# Patient Record
Sex: Male | Born: 1960
Health system: Southern US, Community
[De-identification: ages and names within clinical notes are randomized; demographics above are authoritative.]

## PROBLEM LIST (undated history)

## (undated) DIAGNOSIS — I2510 Atherosclerotic heart disease of native coronary artery without angina pectoris: Secondary | ICD-10-CM

## (undated) DIAGNOSIS — Z7182 Exercise counseling: Secondary | ICD-10-CM

## (undated) DIAGNOSIS — G8929 Other chronic pain: Secondary | ICD-10-CM

## (undated) DIAGNOSIS — M25559 Pain in unspecified hip: Secondary | ICD-10-CM

## (undated) DIAGNOSIS — I10 Essential (primary) hypertension: Secondary | ICD-10-CM

## (undated) DIAGNOSIS — E119 Type 2 diabetes mellitus without complications: Secondary | ICD-10-CM

## (undated) DIAGNOSIS — Z973 Presence of spectacles and contact lenses: Secondary | ICD-10-CM

## (undated) DIAGNOSIS — K08109 Complete loss of teeth, unspecified cause, unspecified class: Secondary | ICD-10-CM

## (undated) DIAGNOSIS — Z993 Dependence on wheelchair: Secondary | ICD-10-CM

## (undated) DIAGNOSIS — M1990 Unspecified osteoarthritis, unspecified site: Secondary | ICD-10-CM

## (undated) DIAGNOSIS — R29898 Other symptoms and signs involving the musculoskeletal system: Secondary | ICD-10-CM

## (undated) DIAGNOSIS — E785 Hyperlipidemia, unspecified: Secondary | ICD-10-CM

## (undated) DIAGNOSIS — Z6841 Body Mass Index (BMI) 40.0 and over, adult: Secondary | ICD-10-CM

## (undated) DIAGNOSIS — Z972 Presence of dental prosthetic device (complete) (partial): Secondary | ICD-10-CM

## (undated) HISTORY — PX: LAPAROSCOPIC CHOLECYSTECTOMY: SUR755

## (undated) HISTORY — PX: INGUINAL HERNIA REPAIR: SUR1180

## (undated) HISTORY — DX: Exercise counseling: Z71.82

## (undated) HISTORY — DX: Essential (primary) hypertension: I10

## (undated) HISTORY — DX: Other symptoms and signs involving the musculoskeletal system: R29.898

## (undated) HISTORY — DX: Hyperlipidemia, unspecified: E78.5

## (undated) HISTORY — DX: Morbid (severe) obesity due to excess calories: E66.01

## (undated) HISTORY — DX: Body Mass Index (BMI) 40.0 and over, adult: Z684

---

## 2011-11-13 ENCOUNTER — Other Ambulatory Visit: Payer: Self-pay | Admitting: Orthopedic Surgery

## 2011-11-13 DIAGNOSIS — M25559 Pain in unspecified hip: Secondary | ICD-10-CM

## 2011-12-03 ENCOUNTER — Other Ambulatory Visit: Payer: Self-pay

## 2011-12-03 ENCOUNTER — Inpatient Hospital Stay: Admission: RE | Admit: 2011-12-03 | Payer: Self-pay | Source: Ambulatory Visit

## 2011-12-03 ENCOUNTER — Ambulatory Visit
Admission: RE | Admit: 2011-12-03 | Discharge: 2011-12-03 | Disposition: A | Discharge status: Home / Self Care | Payer: BC Managed Care – PPO | Source: Ambulatory Visit | Attending: Orthopedic Surgery | Admitting: Orthopedic Surgery

## 2011-12-03 DIAGNOSIS — M25559 Pain in unspecified hip: Secondary | ICD-10-CM

## 2011-12-03 MED ORDER — METHYLPREDNISOLONE ACETATE 40 MG/ML INJ SUSP (RADIOLOG
120.0000 mg | Freq: Once | INTRAMUSCULAR | Status: AC
  Administered 2011-12-03: 120 mg via INTRA_ARTICULAR

## 2011-12-03 MED ORDER — IOHEXOL 180 MG/ML  SOLN
1.0000 mL | Freq: Once | INTRAMUSCULAR | Status: AC | PRN
  Administered 2011-12-03: 1 mL via INTRA_ARTICULAR

## 2012-06-25 ENCOUNTER — Other Ambulatory Visit: Payer: Self-pay | Admitting: Orthopedic Surgery

## 2012-06-25 DIAGNOSIS — M169 Osteoarthritis of hip, unspecified: Secondary | ICD-10-CM

## 2012-06-25 DIAGNOSIS — M25552 Pain in left hip: Secondary | ICD-10-CM

## 2012-07-01 ENCOUNTER — Ambulatory Visit
Admission: RE | Admit: 2012-07-01 | Discharge: 2012-07-01 | Disposition: A | Discharge status: Home / Self Care | Payer: BC Managed Care – PPO | Source: Ambulatory Visit | Attending: Orthopedic Surgery | Admitting: Orthopedic Surgery

## 2012-07-01 DIAGNOSIS — M169 Osteoarthritis of hip, unspecified: Secondary | ICD-10-CM

## 2012-07-01 DIAGNOSIS — M25552 Pain in left hip: Secondary | ICD-10-CM

## 2012-07-01 MED ORDER — METHYLPREDNISOLONE ACETATE 40 MG/ML INJ SUSP (RADIOLOG
120.0000 mg | Freq: Once | INTRAMUSCULAR | Status: AC
  Administered 2012-07-01: 120 mg via INTRA_ARTICULAR

## 2012-07-01 MED ORDER — IOHEXOL 180 MG/ML  SOLN
1.0000 mL | Freq: Once | INTRAMUSCULAR | Status: AC | PRN
  Administered 2012-07-01: 1 mL via INTRA_ARTICULAR

## 2016-11-20 DIAGNOSIS — E119 Type 2 diabetes mellitus without complications: Secondary | ICD-10-CM | POA: Diagnosis not present

## 2016-11-20 DIAGNOSIS — I10 Essential (primary) hypertension: Secondary | ICD-10-CM | POA: Diagnosis not present

## 2016-11-20 DIAGNOSIS — E785 Hyperlipidemia, unspecified: Secondary | ICD-10-CM | POA: Diagnosis not present

## 2016-12-21 ENCOUNTER — Other Ambulatory Visit (HOSPITAL_COMMUNITY): Payer: Self-pay | Admitting: General Surgery

## 2016-12-21 DIAGNOSIS — E1169 Type 2 diabetes mellitus with other specified complication: Secondary | ICD-10-CM | POA: Diagnosis not present

## 2016-12-21 DIAGNOSIS — Z7409 Other reduced mobility: Secondary | ICD-10-CM | POA: Diagnosis not present

## 2016-12-21 DIAGNOSIS — M160 Bilateral primary osteoarthritis of hip: Secondary | ICD-10-CM | POA: Diagnosis not present

## 2016-12-21 DIAGNOSIS — E7800 Pure hypercholesterolemia, unspecified: Secondary | ICD-10-CM | POA: Diagnosis not present

## 2016-12-21 DIAGNOSIS — I10 Essential (primary) hypertension: Secondary | ICD-10-CM | POA: Diagnosis not present

## 2016-12-21 DIAGNOSIS — Z8719 Personal history of other diseases of the digestive system: Secondary | ICD-10-CM | POA: Diagnosis not present

## 2016-12-21 DIAGNOSIS — Z9889 Other specified postprocedural states: Secondary | ICD-10-CM | POA: Diagnosis not present

## 2016-12-21 DIAGNOSIS — E669 Obesity, unspecified: Secondary | ICD-10-CM | POA: Diagnosis not present

## 2017-01-08 ENCOUNTER — Other Ambulatory Visit: Payer: Self-pay

## 2017-01-08 ENCOUNTER — Ambulatory Visit (HOSPITAL_COMMUNITY)
Admission: RE | Admit: 2017-01-08 | Discharge: 2017-01-08 | Disposition: A | Discharge status: Home / Self Care | Payer: Medicare HMO | Source: Ambulatory Visit | Attending: General Surgery | Admitting: General Surgery

## 2017-01-08 DIAGNOSIS — R9431 Abnormal electrocardiogram [ECG] [EKG]: Secondary | ICD-10-CM | POA: Insufficient documentation

## 2017-01-08 DIAGNOSIS — Z01810 Encounter for preprocedural cardiovascular examination: Secondary | ICD-10-CM | POA: Diagnosis not present

## 2017-01-08 DIAGNOSIS — R918 Other nonspecific abnormal finding of lung field: Secondary | ICD-10-CM | POA: Diagnosis not present

## 2017-01-22 ENCOUNTER — Encounter: Payer: Medicare HMO | Attending: General Surgery | Admitting: Registered"

## 2017-01-22 ENCOUNTER — Encounter: Payer: Self-pay | Admitting: Registered"

## 2017-01-22 DIAGNOSIS — Z6841 Body Mass Index (BMI) 40.0 and over, adult: Secondary | ICD-10-CM | POA: Insufficient documentation

## 2017-01-22 DIAGNOSIS — E119 Type 2 diabetes mellitus without complications: Secondary | ICD-10-CM

## 2017-01-22 DIAGNOSIS — E669 Obesity, unspecified: Secondary | ICD-10-CM

## 2017-01-22 DIAGNOSIS — I10 Essential (primary) hypertension: Secondary | ICD-10-CM | POA: Insufficient documentation

## 2017-01-22 DIAGNOSIS — Z713 Dietary counseling and surveillance: Secondary | ICD-10-CM | POA: Insufficient documentation

## 2017-01-22 NOTE — Patient Instructions (Signed)
-   Reduce coffee intake.

## 2017-01-22 NOTE — Progress Notes (Signed)
Pre-Op Assessment Visit:  Pre-Operative Sleeve gastrectomy Surgery  Medical Nutrition Therapy:  Appt start time: 9:00  End time:  9:50  Patient was seen on 01/22/2017 for Pre-Operative Nutrition Assessment. Assessment and letter of approval faxed to [facility] Bariatric Surgery Program coordinator on 01/22/2017.   Pt expectation of surgery: to be able to walk again, to have 2 hip replacements  Pt expectation of Dietitian: none stated  Start weight at [facility]: 360.6 BMI: 53.25   Pt states he has had diabetes 5-10 years. Pt states he seldom checks his blood sugar, but when he does it is usually FBS (100). Pt states he does not experience hypoglycemia. Pt states his last A1c was 2 weeks agi but has not received results yet. Per referral, pt's last A1c was 7.1. Pt states he drinks a lot of coffee.   Per insurance, pt needs 6 SWL visits prior to surgery.    24 hr Dietary Recall: First Meal: sometimes skips; sausage, eggs, biscuit Snack: none  Second Meal: skips Snack: none Third Meal: meatloaf, peas, corn, mac and cheese Snack: sometimes oatmeal cookie Beverages: coffee, water, orange juice  Encouraged to engage in 45 minutes of moderate physical activity including cardiovascular and weight baring weekly  Handouts given during visit include:  . Pre-Op Goals . Bariatric Surgery Protein Shakes . Vitamin and Mineral Recommendations  During the appointment today the following Pre-Op Goals were reviewed with the patient: . Maintain or lose weight as instructed by your surgeon . Make healthy food choices . Begin to limit portion sizes . Limited concentrated sugars and fried foods . Keep fat/sugar in the single digits per serving on         food labels . Practice CHEWING your food  (aim for 30 chews per bite or until applesauce consistency) . Practice not drinking 15 minutes before, during, and 30 minutes after each meal/snack . Avoid all carbonated beverages   . Avoid/limit caffeinated beverages  . Avoid all sugar-sweetened beverages . Consume 3 meals per day; eat every 3-5 hours . Make a list of non-food related activities . Aim for 64-100 ounces of FLUID daily  . Aim for at least 60-80 grams of PROTEIN daily . Look for a liquid protein source that contain ?15 g protein and ?5 g carbohydrate  (ex: shakes, drinks, shots) . Physical activity is an important part of a healthy lifestyle so keep it moving!  - Reduce coffee intake.   Follow diet recommendations listed below Energy and Macronutrient Recommendations: Calories: 2000 Carbohydrate: 225 Protein: 150 Fat: 56  Demonstrated degree of understanding via:  Teach Back   Teaching Method Utilized:  Visual Auditory Hands on  Barriers to learning/adherence to lifestyle change: limited physical mobility  Patient to call the [facility] to enroll in Pre-Op and Post-Op Nutrition Education when surgery date is scheduled.

## 2017-01-24 ENCOUNTER — Ambulatory Visit: Payer: Medicare HMO | Admitting: Registered"

## 2017-02-20 DIAGNOSIS — Z23 Encounter for immunization: Secondary | ICD-10-CM | POA: Diagnosis not present

## 2017-02-20 DIAGNOSIS — E559 Vitamin D deficiency, unspecified: Secondary | ICD-10-CM | POA: Diagnosis not present

## 2017-02-20 DIAGNOSIS — I10 Essential (primary) hypertension: Secondary | ICD-10-CM | POA: Diagnosis not present

## 2017-02-20 DIAGNOSIS — E119 Type 2 diabetes mellitus without complications: Secondary | ICD-10-CM | POA: Diagnosis not present

## 2017-02-20 DIAGNOSIS — E785 Hyperlipidemia, unspecified: Secondary | ICD-10-CM | POA: Diagnosis not present

## 2017-02-21 ENCOUNTER — Encounter: Payer: Self-pay | Admitting: Registered"

## 2017-02-21 ENCOUNTER — Encounter: Payer: Medicare HMO | Attending: General Surgery | Admitting: Registered"

## 2017-02-21 DIAGNOSIS — E119 Type 2 diabetes mellitus without complications: Secondary | ICD-10-CM | POA: Diagnosis not present

## 2017-02-21 DIAGNOSIS — Z6841 Body Mass Index (BMI) 40.0 and over, adult: Secondary | ICD-10-CM | POA: Diagnosis not present

## 2017-02-21 DIAGNOSIS — Z713 Dietary counseling and surveillance: Secondary | ICD-10-CM | POA: Diagnosis not present
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
## 2017-02-21 NOTE — Patient Instructions (Addendum)
-   Aiming to eat 3 meals a day and add snacks between meals.   - Increase physical activity to at least 10-15 min, 3 days a week.

## 2017-02-26 ENCOUNTER — Ambulatory Visit: Payer: Medicare HMO | Admitting: Psychiatry

## 2017-03-19 ENCOUNTER — Encounter: Payer: Medicare HMO | Attending: General Surgery | Admitting: Registered"

## 2017-03-19 ENCOUNTER — Encounter: Payer: Self-pay | Admitting: Registered"

## 2017-03-19 DIAGNOSIS — E119 Type 2 diabetes mellitus without complications: Secondary | ICD-10-CM | POA: Insufficient documentation

## 2017-03-19 DIAGNOSIS — E669 Obesity, unspecified: Secondary | ICD-10-CM

## 2017-03-19 DIAGNOSIS — Z6841 Body Mass Index (BMI) 40.0 and over, adult: Secondary | ICD-10-CM | POA: Diagnosis not present

## 2017-03-19 DIAGNOSIS — Z713 Dietary counseling and surveillance: Secondary | ICD-10-CM | POA: Diagnosis not present

## 2017-03-19 NOTE — Progress Notes (Signed)
Appt start time: 9:05 end time: 9:18  Assessment: 2nd SWL Appointment.   Start Wt at [facility]: 360.6 Wt: 360.5 BMI: 53.24   Pt arrives having gained 12.8 lbs from previous visit.   Pt states he has done well with eating 3 meals a day. Pt states he drinks at least 2 32-oz bottles of water a day, along with 2 cups of coffee and juice. Pt states he sips is his fluid throughout the day. 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 Pt states he has not been able to do well with increasing physical activity since previous visit. Pt states he did it a few days and stopped.    MEDICATIONS: See list   DIETARY INTAKE:  24-hr recall:  B ( AM): sausage, egg, cheese biscuit  Snk ( AM): none  L ( PM): ham and cheese sandwich  Snk ( PM): none D ( PM): salisbury steak, mixed vegetables, potatoes  Snk ( PM): none Beverages: water, orange juice, 2 coffee   Usual physical activity: none stated  Diet to Follow: 2000 calories 225 g carbohydrates 150 g protein 56 g fat  Preferred Learning Style:   Auditory  Visual  Hands on  Learning Readiness:   Contemplating  Ready  Change in progress     Nutritional Diagnosis:  [place]-3.3 Overweight/obesity related to past poor dietary habits and physical inactivity as evidenced by patient w/ planned sleeve gastrectomy surgery following dietary guidelines for continued weight loss.    Intervention:  Nutrition counseling for upcoming Bariatric Surgery.  Goals:  - Increase physical activity to at least 10-15 min, 3 days a week; arm exercises handout.  - Keep up the good work with what you're already doing.   Teaching Method Utilized:  Visual Auditory Hands on  Handouts given during visit include:  none  Barriers to learning/adherence to lifestyle change: limited physical ability  Demonstrated degree of understanding via:  Teach Back   Monitoring/Evaluation:  Dietary intake, exercise, and  body weight in 1 month(s).

## 2017-03-19 NOTE — Patient Instructions (Addendum)
-   Increase physical activity to at least 10-15 min, 3 days a week; arm exercises handout.   - Keep up the good work with what you're already doing.

## 2017-03-28 ENCOUNTER — Ambulatory Visit (INDEPENDENT_AMBULATORY_CARE_PROVIDER_SITE_OTHER): Payer: Medicare HMO | Admitting: Psychiatry

## 2017-03-28 DIAGNOSIS — R69 Illness, unspecified: Secondary | ICD-10-CM | POA: Diagnosis not present

## 2017-03-28 DIAGNOSIS — F509 Eating disorder, unspecified: Secondary | ICD-10-CM

## 2017-04-18 ENCOUNTER — Encounter: Payer: Self-pay | Admitting: Registered"

## 2017-04-18 ENCOUNTER — Encounter: Payer: Medicare HMO | Attending: General Surgery | Admitting: Registered"

## 2017-04-18 DIAGNOSIS — Z713 Dietary counseling and surveillance: Secondary | ICD-10-CM | POA: Diagnosis not present

## 2017-04-18 DIAGNOSIS — E119 Type 2 diabetes mellitus without complications: Secondary | ICD-10-CM | POA: Diagnosis not present

## 2017-04-18 DIAGNOSIS — Z6841 Body Mass Index (BMI) 40.0 and over, adult: Secondary | ICD-10-CM | POA: Diagnosis not present

## 2017-04-18 DIAGNOSIS — E669 Obesity, unspecified: Secondary | ICD-10-CM

## 2017-04-18 NOTE — Progress Notes (Signed)
Appt start time: 9:00 end time: 9:15  Assessment: 3rd SWL Appointment.   Start Wt at [facility]: 360.6 Wt: 354.2 BMI: 52.31   Pt arrives having lost 6.3 lbs from previous visit. Pt states he has reduced drinking orange juice and has begun eating more oranges instead. Pt states he has been working out 30-60 min a day, almost daily with arm exercises most days. Pt states he has already started working on chewing well, but sometimes forgets. 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 Pt states he is still doing well with drinking a lot of water and eating 3 meals a day.     MEDICATIONS: See list   DIETARY INTAKE:  24-hr recall:  B ( AM): sausage, egg, cheese biscuit  Snk ( AM): none L ( PM): ham and cheese sandwich  Snk ( PM): 2 orange D ( PM): salisbury steak, mixed vegetables, potatoes  Snk ( PM): orange Beverages: water, small amount of orange juice, 2 coffee   Usual physical activity: arm exercises with 10 lbs barbells 30-60 min, 6 days/week  Diet to Follow: 2000 calories 225 g carbohydrates 150 g protein 56 g fat  Preferred Learning Style:   Auditory  Visual  Hands on  Learning Readiness:   Contemplating  Ready  Change in progress     Nutritional Diagnosis:  [place]-3.3 Overweight/obesity related to past poor dietary habits and physical inactivity as evidenced by patient w/ planned sleeve gastrectomy surgery following dietary guidelines for continued weight loss.    Intervention:  Nutrition counseling for upcoming Bariatric Surgery.  Goals:  - Aim to focus on chewing at least 30 times per bite or to applesauce consistency, especially with lunch to help with portion control.  - Aim to listen to your body for satisfaction when eating.    Teaching Method Utilized:  Visual Auditory Hands on  Handouts given during visit include:  none  Barriers to  learning/adherence to lifestyle change: limited physical ability  Demonstrated degree of understanding via:  Teach Back   Monitoring/Evaluation:  Dietary intake, exercise, and body weight in 1 month(s).

## 2017-04-18 NOTE — Patient Instructions (Signed)
-   Aim to focus on chewing at least 30 times per bite or to applesauce consistency, especially with lunch to help with portion control.   - Aim to listen to your body for satisfaction when eating.

## 2017-05-17 ENCOUNTER — Encounter: Payer: Medicare HMO | Attending: General Surgery | Admitting: Registered"

## 2017-05-17 ENCOUNTER — Ambulatory Visit: Payer: Self-pay | Admitting: Registered"

## 2017-05-17 ENCOUNTER — Encounter: Payer: Self-pay | Admitting: Registered"

## 2017-05-17 DIAGNOSIS — Z713 Dietary counseling and surveillance: Secondary | ICD-10-CM | POA: Diagnosis not present

## 2017-05-17 DIAGNOSIS — Z6841 Body Mass Index (BMI) 40.0 and over, adult: Secondary | ICD-10-CM | POA: Diagnosis not present

## 2017-05-17 DIAGNOSIS — E119 Type 2 diabetes mellitus without complications: Secondary | ICD-10-CM | POA: Insufficient documentation

## 2017-05-17 NOTE — Progress Notes (Signed)
Appt start time: 9:20 end time: 9:40  Assessment: 4th SWL Appointment.   Start Wt at [facility]: 360.6 Wt: 343.7 BMI: 50.76    Pt arrives having lost 10.5 lbs from previous visit. Pt states his brother had bariatric surgery and gave him a lot of protein shakes. Pt states he likes Premier Protein vanilla, Muscle Milk chocolate/dark chocolate flavors. Pt states he can hear when body is satisfied with amount eaten. Pt states he is eating 3-4 meals/day. Pt states he has diabetes but never has problems with it. Pt states he does not check BS hardly ever. Pt states his last A1c was good, but unsure the value, unsure when it was done. Pt states his A1c is checked every 6 months. Pt states he is aware of hypoglycemic sign/symptoms and knows what to do. Pt states he is doing better with chewing, sometimes forgets to chew when watching tv while eating.   Pt states he has reduced drinking orange juice and has begun eating more oranges instead. Pt states he has been working out 30-60 min a day, almost daily with arm exercises most days. Pt states he has already started working on chewing well, but sometimes forgets. 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 Pt states he is still doing well with drinking a lot of water and eating 3 meals a day.     MEDICATIONS: See list   DIETARY INTAKE:  24-hr recall:  B ( AM): sausage, egg, cheese biscuit  Snk ( AM): none L ( PM): ham and cheese sandwich  Snk ( PM): 2 orange D ( PM): salisbury steak, mixed vegetables, potatoes  Snk ( PM): orange Beverages: water, small amount of orange juice, 2 coffee   Usual physical activity: arm exercises with 10 lbs barbells 30-60 min, 6 days/week  Diet to Follow: 2000 calories 225 g carbohydrates 150 g protein 56 g fat  Preferred Learning Style:   Auditory  Visual  Hands on  Learning Readiness:    Contemplating  Ready  Change in progress     Nutritional Diagnosis:  [place]-3.3 Overweight/obesity related to past poor dietary habits and physical inactivity as evidenced by patient w/ planned sleeve gastrectomy surgery following dietary guidelines for continued weight loss.    Intervention:  Nutrition counseling for upcoming Bariatric Surgery.  Goals:  - Practice mindful eating. Aim to not eat while watching tv. Helps to remember chewing. - Practice not drinking while eating.  - Aim to check blood sugars at least 2 times a day and track numbers.   Teaching Method Utilized:  Visual Auditory Hands on  Handouts given during visit include:  none  Barriers to learning/adherence to lifestyle change: limited physical ability  Demonstrated degree of understanding via:  Teach Back   Monitoring/Evaluation:  Dietary intake, exercise, and body weight in 1 month(s).

## 2017-05-17 NOTE — Patient Instructions (Addendum)
-   Practice mindful eating. Aim to not eat while watching tv. Helps to remember chewing.  - Practice not drinking while eating.   - Aim to check blood sugars at least 2 times a day and track numbers.

## 2017-06-11 ENCOUNTER — Encounter: Payer: Medicare HMO | Attending: General Surgery | Admitting: Registered"

## 2017-06-11 ENCOUNTER — Encounter: Payer: Self-pay | Admitting: Registered"

## 2017-06-11 DIAGNOSIS — E119 Type 2 diabetes mellitus without complications: Secondary | ICD-10-CM | POA: Diagnosis not present

## 2017-06-11 DIAGNOSIS — Z713 Dietary counseling and surveillance: Secondary | ICD-10-CM | POA: Insufficient documentation

## 2017-06-11 DIAGNOSIS — Z6841 Body Mass Index (BMI) 40.0 and over, adult: Secondary | ICD-10-CM | POA: Diagnosis not present

## 2017-06-11 NOTE — Progress Notes (Signed)
Appt start time: 9:00 end time: 9:20  Assessment: 5th SWL Appointment.   Start Wt at [facility]: 360.6 Wt: 356.1 BMI: 52.59   Pt arrives having gained 12.4 lbs from previous visit.  Pt states he feels rough when he eats 4-5 times/day, feels better when he eats a good breakfast and drinks 3 shakes for the rest of the day. Pt states he checked his BS daily for one week: FBS (110-112). Pt states he is doing well with not drinking while eating. Pt reports waiting 30 minutes before and after eating; going well.   Pt states he likes Premier Protein/Equate vanilla, Muscle Milk chocolate/dark chocolate flavors. Pt states he can hear when body is satisfied with amount eaten. Pt states he is eating 3-4 meals/day. Pt states he has diabetes but never has problems with it. Pt states he does not check BS hardly ever. Pt states his last A1c was good, but unsure the value, unsure when it was done. Pt states his A1c is checked every 6 months. Pt states he is aware of hypoglycemic sign/symptoms and knows what to do. Pt states he is doing better with chewing, sometimes forgets to chew when watching tv while eating.   Pt states he has been working out 30-60 min a day, almost daily with arm exercises most days. 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 Pt states he is still doing well with drinking a lot of water and eating 3 meals a day.     MEDICATIONS: See list   DIETARY INTAKE:  24-hr recall:  B ( AM): sausage, egg, cheese biscuit  Snk ( AM): none L ( PM): ham and cheese sandwich  Snk ( PM): 2 orange D ( PM): salisbury steak, mixed vegetables, potatoes  Snk ( PM): orange Beverages: water, small amount of orange juice, 2 coffee   Usual physical activity: arm exercises with 10 lbs barbells 30-60 min, 6 days/week  Diet to Follow: 2000 calories 225 g carbohydrates 150 g protein 56 g  fat  Preferred Learning Style:   Auditory  Visual  Hands on  Learning Readiness:   Contemplating  Ready  Change in progress     Nutritional Diagnosis:  [place]-3.3 Overweight/obesity related to past poor dietary habits and physical inactivity as evidenced by patient w/ planned sleeve gastrectomy surgery following dietary guidelines for continued weight loss.    Intervention:  Nutrition counseling for upcoming Bariatric Surgery.  Goals:  - Practice mindful eating. Aim to not eat while watching tv. Helps to remember chewing. - Practice not drinking while eating.  - Aim to check blood sugars at least 2 times a day and track numbers.   Teaching Method Utilized:  Visual Auditory Hands on  Handouts given during visit include:  none  Barriers to learning/adherence to lifestyle change: limited physical ability  Demonstrated degree of understanding via:  Teach Back   Monitoring/Evaluation:  Dietary intake, exercise, and body weight in 1 month(s).

## 2017-06-11 NOTE — Patient Instructions (Addendum)
-   Aim to reduce protein shake consumption and eat more well-balanced meals.   - Increase non-starchy vegetable intake.

## 2017-06-24 DIAGNOSIS — I10 Essential (primary) hypertension: Secondary | ICD-10-CM | POA: Diagnosis not present

## 2017-06-24 DIAGNOSIS — M160 Bilateral primary osteoarthritis of hip: Secondary | ICD-10-CM | POA: Diagnosis not present

## 2017-06-24 DIAGNOSIS — Z1159 Encounter for screening for other viral diseases: Secondary | ICD-10-CM | POA: Diagnosis not present

## 2017-06-24 DIAGNOSIS — Z713 Dietary counseling and surveillance: Secondary | ICD-10-CM | POA: Diagnosis not present

## 2017-06-24 DIAGNOSIS — Z6841 Body Mass Index (BMI) 40.0 and over, adult: Secondary | ICD-10-CM | POA: Diagnosis not present

## 2017-06-24 DIAGNOSIS — Z7189 Other specified counseling: Secondary | ICD-10-CM | POA: Diagnosis not present

## 2017-06-24 DIAGNOSIS — Z7182 Exercise counseling: Secondary | ICD-10-CM | POA: Diagnosis not present

## 2017-06-24 DIAGNOSIS — Z125 Encounter for screening for malignant neoplasm of prostate: Secondary | ICD-10-CM | POA: Diagnosis not present

## 2017-06-24 DIAGNOSIS — E119 Type 2 diabetes mellitus without complications: Secondary | ICD-10-CM | POA: Diagnosis not present

## 2017-07-10 ENCOUNTER — Encounter: Payer: Self-pay | Admitting: Registered"

## 2017-07-10 ENCOUNTER — Encounter: Payer: Medicare HMO | Attending: General Surgery | Admitting: Registered"

## 2017-07-10 DIAGNOSIS — Z6841 Body Mass Index (BMI) 40.0 and over, adult: Secondary | ICD-10-CM | POA: Insufficient documentation

## 2017-07-10 DIAGNOSIS — Z713 Dietary counseling and surveillance: Secondary | ICD-10-CM | POA: Diagnosis not present

## 2017-07-10 DIAGNOSIS — E119 Type 2 diabetes mellitus without complications: Secondary | ICD-10-CM | POA: Insufficient documentation

## 2017-07-10 NOTE — Progress Notes (Signed)
Appt start time: 8:47 end time: 9:10  Assessment: 6th SWL Appointment.   Start Wt at [facility]: 360.6 Wt: 340.4 BMI: 50.27   Pt arrives having gained 15.7 lbs from previous visit. Pt states he is still working on not drinking with meals and sometimes forgets. Pt states he checks his BS 4-5x/week: FBS (100-120). Pt states he drinks plenty of water, exceeding 64 ounces of fluid a day.   Pt states he likes Premier Protein/Equate vanilla, Muscle Milk chocolate/dark chocolate flavors. Pt states he can hear when body is satisfied with amount eaten. Pt states he is eating 3-4 meals/day. Pt states he has diabetes but never has problems with it. Pt states he is doing better with chewing, sometimes forgets to chew when watching tv while eating.   Pt states he has been working out 30-60 min a day, almost daily with arm exercises most days. 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 Pt states he is still doing well with drinking a lot of water and eating 3 meals a day.     MEDICATIONS: See list   DIETARY INTAKE:  24-hr recall:  B ( AM): sausage, egg, cheese biscuit  Snk ( AM): none L ( PM): ham and cheese sandwich  Snk ( PM): 2 orange D ( PM): salisbury steak, mixed vegetables, potatoes  Snk ( PM): orange Beverages: water, small amount of orange juice, 2 coffee   Usual physical activity: arm exercises with 10 lbs barbells 30-60 min, 6 days/week  Diet to Follow: 2000 calories 225 g carbohydrates 150 g protein 56 g fat  Preferred Learning Style:   Auditory  Visual  Hands on  Learning Readiness:   Contemplating  Ready  Change in progress     Nutritional Diagnosis:  [place]-3.3 Overweight/obesity related to past poor dietary habits and physical inactivity as evidenced by patient w/ planned sleeve gastrectomy surgery following dietary guidelines for continued weight loss.     Intervention:  Nutrition counseling for upcoming Bariatric Surgery.  Goals:  - Continue to aim to not drinking while eating. Aim to not drink 15 minutes before eating, not while eating, and waiting 30 minutes after eating.  - Continue to practice eating meals at the table with tv off.  - Keep up the great work!  Teaching Method Utilized:  Visual Auditory Hands on  Handouts given during visit include:  none  Barriers to learning/adherence to lifestyle change: limited physical ability  Demonstrated degree of understanding via:  Teach Back   Monitoring/Evaluation:  Dietary intake, exercise, and body weight prn.

## 2017-07-10 NOTE — Patient Instructions (Signed)
-   Continue to aim to not drinking while eating. Aim to not drink 15 minutes before eating, not while eating, and waiting 30 minutes after eating.   - Continue to practice eating meals at the table with tv off.   - Keep up the great work!

## 2017-07-15 DIAGNOSIS — Z125 Encounter for screening for malignant neoplasm of prostate: Secondary | ICD-10-CM | POA: Diagnosis not present

## 2017-07-15 DIAGNOSIS — E119 Type 2 diabetes mellitus without complications: Secondary | ICD-10-CM | POA: Diagnosis not present

## 2017-07-15 DIAGNOSIS — I10 Essential (primary) hypertension: Secondary | ICD-10-CM | POA: Diagnosis not present

## 2017-07-15 DIAGNOSIS — Z1159 Encounter for screening for other viral diseases: Secondary | ICD-10-CM | POA: Diagnosis not present

## 2017-07-29 ENCOUNTER — Encounter: Payer: Medicare HMO | Attending: General Surgery | Admitting: Skilled Nursing Facility1

## 2017-07-29 DIAGNOSIS — Z6841 Body Mass Index (BMI) 40.0 and over, adult: Secondary | ICD-10-CM | POA: Diagnosis not present

## 2017-07-29 DIAGNOSIS — Z713 Dietary counseling and surveillance: Secondary | ICD-10-CM | POA: Insufficient documentation

## 2017-07-29 DIAGNOSIS — E119 Type 2 diabetes mellitus without complications: Secondary | ICD-10-CM | POA: Diagnosis not present

## 2017-07-30 NOTE — Progress Notes (Signed)
Pre-Operative Nutrition Class:  Appt start time: 2035   End time:  1830.  Patient was seen on 07/29/2017 for Pre-Operative Bariatric Surgery Education [facility].   Surgery date:  Surgery type: sleeve Start weight at [facility]: 360.6 Weight today: 345  Samples given per MNT protocol. Patient educated on appropriate usage: Bariatric Advantage Multivitamin Lot # [id number] Exp: 07/19  Bariatric Advantage Calcium  Lot # [id number] Exp: sep-11-2017  Renee Pain Protein Shake Lot # [id number] Exp: oct-13-2019  The following the learning objectives were met by the patient during this course:  Identify Pre-Op Dietary Goals and will begin 2 weeks pre-operatively  Identify appropriate sources of fluids and proteins   State protein recommendations and appropriate sources pre and post-operatively  Identify Post-Operative Dietary Goals and will follow for 2 weeks post-operatively  Identify appropriate multivitamin and calcium sources  Describe the need for physical activity post-operatively and will follow MD recommendations  State when to call healthcare provider regarding medication questions or post-operative complications  Handouts given during class include:  Pre-Op Bariatric Surgery Diet Handout  Protein Shake Handout  Post-Op Bariatric Surgery Nutrition Handout  [facility]EL[facility] Program Information Flyer  Support Group Information Flyer  [facility] Bariatric Supplements Price List  [facility]: [facility] per MD.

## 2017-09-18 ENCOUNTER — Telehealth: Payer: Self-pay

## 2017-09-18 NOTE — Telephone Encounter (Signed)
   [facility] Pre-operative Risk Assessment    Request for surgical clearance:  1. What type of surgery is being performed? Gastric sleeve sx/morbid obesity    2. When is this surgery scheduled? TBD   3. What type of clearance is required (medical clearance vs. Pharmacy clearance to hold med vs. Both)? Medical  4. Are there any medications that need to be held prior to surgery and how long? None Specified    5. Practice name and name of physician performing surgery? [facility], Dr.Eric Wilson   6. What is your office phone number [phone]    7.   What is your office fax number [phone]  8.   Anesthesia type (None, local, MAC, general) ? None Specified    Mendel Ryder 09/18/2017, 11:22 AM  _________________________________________________________________   (provider comments below)

## 2017-09-19 ENCOUNTER — Ambulatory Visit (INDEPENDENT_AMBULATORY_CARE_PROVIDER_SITE_OTHER): Payer: Medicare HMO | Admitting: Psychiatry

## 2017-09-19 DIAGNOSIS — F509 Eating disorder, unspecified: Secondary | ICD-10-CM | POA: Diagnosis not present

## 2017-09-19 DIAGNOSIS — R69 Illness, unspecified: Secondary | ICD-10-CM | POA: Diagnosis not present

## 2017-09-19 NOTE — Telephone Encounter (Signed)
Please contact the requesting provider to make sure they want the patient to be seen for preoperative clearance since there is no record we have ever seen this patient before (at least not since Epic electronic medical record system has been implemented). If they want a cardiac clearance, patient to need to come in and be seen as a new patient consultation instead.   Ramond Dial PA Pager: [phone]

## 2017-09-19 NOTE — Telephone Encounter (Signed)
Spoke with Triage nurse from [facility] who states they sent over a referral for pt to be seen as a new pt for pre-op clearance. She informed that she will refax referral request.

## 2017-09-20 NOTE — Telephone Encounter (Signed)
SPOKE WITH PT AND PT AGREED TO NEW PT APPT WITH DR END ON 10-25-17 AT 9 AM..

## 2017-09-24 DIAGNOSIS — Z7182 Exercise counseling: Secondary | ICD-10-CM | POA: Diagnosis not present

## 2017-09-24 DIAGNOSIS — M160 Bilateral primary osteoarthritis of hip: Secondary | ICD-10-CM | POA: Diagnosis not present

## 2017-09-24 DIAGNOSIS — Z6841 Body Mass Index (BMI) 40.0 and over, adult: Secondary | ICD-10-CM | POA: Diagnosis not present

## 2017-09-24 DIAGNOSIS — Z7189 Other specified counseling: Secondary | ICD-10-CM | POA: Diagnosis not present

## 2017-09-24 DIAGNOSIS — E119 Type 2 diabetes mellitus without complications: Secondary | ICD-10-CM | POA: Diagnosis not present

## 2017-09-24 DIAGNOSIS — I10 Essential (primary) hypertension: Secondary | ICD-10-CM | POA: Diagnosis not present

## 2017-09-24 DIAGNOSIS — E785 Hyperlipidemia, unspecified: Secondary | ICD-10-CM | POA: Diagnosis not present

## 2017-09-24 DIAGNOSIS — R29898 Other symptoms and signs involving the musculoskeletal system: Secondary | ICD-10-CM | POA: Diagnosis not present

## 2017-09-24 DIAGNOSIS — Z713 Dietary counseling and surveillance: Secondary | ICD-10-CM | POA: Diagnosis not present

## 2017-10-25 ENCOUNTER — Encounter (INDEPENDENT_AMBULATORY_CARE_PROVIDER_SITE_OTHER): Payer: Self-pay

## 2017-10-25 ENCOUNTER — Ambulatory Visit: Payer: Medicare HMO | Admitting: Internal Medicine

## 2017-10-25 ENCOUNTER — Encounter: Payer: Self-pay | Admitting: Internal Medicine

## 2017-10-25 DIAGNOSIS — Z01810 Encounter for preprocedural cardiovascular examination: Secondary | ICD-10-CM | POA: Diagnosis not present

## 2017-10-25 NOTE — Progress Notes (Signed)
New Outpatient Visit Date: 10/25/2017  Referring Provider: Gaynelle Adu, MD [facility] [address] [place], [place], [address]  Chief Complaint: Preoperative evaluation  HPI:  Mr. Pense is a 57 y.o. male who is being seen today for preoperative cardiovascular risk assessment in anticipation of bariatric surgery at the request of Dr. Andrey Campanile. He has a history of hypertension, hyperlipidemia, type 2 diabetes mellitus, morbid obesity, and incisional hernia repair.  Mr. Borjon is without complaints today other than chronic hip pain.  He is wheelchair-bound due to his hip pain, but notes that his hips cannot be intervened upon surgically until he loses weight.  He has struggled with his weight for many years and has been unable to drop below 330 pounds despite multiple diets.  Mr. Abdallah denies a history of cardiac disease or previous perioperative cardiovascular complications.  He has not had any chest pain, shortness of breath, palpitations, lightheadedness, or edema.  Of note, he is wheelchair-bound and does not perform any physical activity.  --------------------------------------------------------------------------------------------------  Cardiovascular History & Procedures: Cardiovascular Problems:  None  Risk Factors:  Hypertension, hyperlipidemia, type 2 diabetes mellitus, obesity, male gender, and age greater than 68  Cath/PCI:  None  CV Surgery:  None  EP Procedures and Devices:  None  Non-Invasive Evaluation(s):  None  --------------------------------------------------------------------------------------------------  Past Medical History:  Diagnosis Date  . Body mass index (BMI) 45.0-49.9, adult (HCC)   . Decreased strength of upper extremity   . Diabetes mellitus without complication (HCC)   . Exercise counseling   . Hyperlipidemia   . Hypertension   . Morbid obesity due to excess calories (HCC)   . Osteoarthritis of both hips       Past Surgical History:  Procedure Laterality Date  . CHOLECYSTECTOMY    . HERNIA REPAIR      Current Meds  Medication Sig  . aspirin 81 MG chewable tablet Chew 81 mg by mouth daily.  Marland Kitchen atorvastatin (LIPITOR) 20 MG tablet Take 20 mg by mouth daily.  . cyanocobalamin 1000 MCG tablet Take 1,000 mcg by mouth daily.  Marland Kitchen losartan (COZAAR) 100 MG tablet Take 100 mg by mouth daily.  . metFORMIN (GLUCOPHAGE) 1000 MG tablet Take 1,000 mg by mouth 2 (two) times daily with a meal.  . methocarbamol (ROBAXIN) 750 MG tablet Take 750 mg by mouth daily.   . naproxen (NAPROSYN) 500 MG tablet Take 500 mg by mouth 2 (two) times daily with a meal.  . VITAMIN D, ERGOCALCIFEROL, PO Take by mouth as directed.     Allergies: Patient has no known allergies.  Social History   Tobacco Use  . Smoking status: Never Smoker  . Smokeless tobacco: Never Used  Substance Use Topics  . Alcohol use: Never    Frequency: Never  . Drug use: Never    Family History  Problem Relation Age of Onset  . Cancer Other   . Hypertension Other   . Diabetes Other   . Cancer Father        Liver    Review of Systems: A 12-system review of systems was performed and was negative except as noted in the HPI.  --------------------------------------------------------------------------------------------------  Physical Exam: BP 100/62   Pulse 84   Ht 5\' 9"  (1.753 m)   Wt (!) 340 lb 12 oz (154.6 kg)   SpO2 95%   BMI 50.32 kg/m    General: Morbidly obese man, seated in a power wheelchair. HEENT: No conjunctival pallor or scleral icterus.  Moist mucous membranes. OP clear. Neck: Supple without lymphadenopathy, thyromegaly, JVD, or HJR though evaluation is limited by body habitus and overlying facial hair. No carotid bruit. Lungs: Normal work of breathing. Clear to auscultation bilaterally without wheezes or crackles. Heart: Heart sounds.  Regular rate and rhythm without murmurs, rubs, or gallops.  Unable to assess PMI  due to body habitus. Abd: Bowel sounds present. Soft, NT/ND.  Unable to assess HSM due to body habitus. Ext: No lower extremity edema. Radial, PT, and DP pulses are 2+ bilaterally Skin: Warm and dry without rash. Neuro: CNIII-XII intact. Strength and fine-touch sensation intact in upper and lower extremities bilaterally. Psych: Normal mood and affect.  EKG:  Normal sinus rhythm with low voltage and poor R wave progression that may reflect lead placement or prior anterior infarct.  No results found for: WBC, HGB, HCT, MCV, PLT  No results found for: NA, K, CL, CO2, BUN, CREATININE, GLUCOSE, ALT  No results found for: CHOL, HDL, LDLCALC, LDLDIRECT, TRIG, CHOLHDL   --------------------------------------------------------------------------------------------------  ASSESSMENT AND PLAN: Morbid obesity and preoperative cardiovascular risk assessment Mr. Dewaine CongerBarker has struggled with his weight for many years and remains morbidly obese despite lifestyle modifications.  He is contemplating bariatric surgery to help with weight loss.  Unfortunately, Mr. Karmen StabsBarker's mobility makes it challenging to objectively assess his perioperative cardiovascular risk.  He has multiple cardiac risk factors (hypertension, hyperlipidemia, diabetes, male gender, age, and morbid obesity) as well as an abnormal EKG with poor R wave progression that could reflect prior anterior infarct.  I have recommended noninvasive ischemia testing prior to proceeding with any elective surgery.  I would favor a myocardial [facility], as sensitivity and specificity of this study is superior than standard nuclear stress test in patients with BMI greater than 40.  However, Mr. Dewaine CongerBarker says he is unable to [place] to [facility].  I have offered pharmacologic myocardial perfusion stress test and cardiac CTA through our practice, though either study would affected by attenuation artifact.  Cardiac catheterization is another option, though given the  lack of symptoms, I do not feel that the benefits of invasive testing outweigh the risks.  I do not believe we would obtain adequate windows for dobutamine stress echo.  Mr. Dewaine CongerBarker would like to contemplate his choices further and will contact our office if he wishes to proceed with any of the aforementioned testing.  Final perioperative cardiovascular risks assessment will be made after completion of testing.  Follow-up: Return to clinic as needed.  Yvonne Kendallhristopher Othman Masur, MD 10/26/2017 12:42 PM

## 2017-10-25 NOTE — Patient Instructions (Addendum)
Medication Instructions:  Your physician recommends that you continue on your current medications as directed. Please refer to the Current Medication list given to you today.  -- If you need a refill on your cardiac medications before your next appointment, please call your pharmacy. --  Labwork: None ordered  Testing/Procedures:  Please call us with decision, thank you Lexiscan Myoview stress test PET CT [facility]  Follow-Up: Your physician wants you to follow-up AS NEEDED with Dr. Okey DupreEnd.    Thank you for choosing [facility]!!    Any Other Special Instructions Will Be Listed Below (If Applicable).

## 2017-10-26 ENCOUNTER — Encounter: Payer: Self-pay | Admitting: Internal Medicine

## 2017-10-26 DIAGNOSIS — Z01810 Encounter for preprocedural cardiovascular examination: Secondary | ICD-10-CM | POA: Insufficient documentation

## 2017-10-26 DIAGNOSIS — I10 Essential (primary) hypertension: Secondary | ICD-10-CM | POA: Insufficient documentation

## 2017-11-04 ENCOUNTER — Telehealth: Payer: Self-pay | Admitting: Internal Medicine

## 2017-11-04 DIAGNOSIS — R079 Chest pain, unspecified: Secondary | ICD-10-CM

## 2017-11-04 NOTE — Telephone Encounter (Signed)
New Message:      Pt is calling and states him and Dr. Okey DupreEnd had discussed doing a stress test.

## 2017-11-04 NOTE — Telephone Encounter (Signed)
Called patient back. Patient would like to get lexiscan myoview. Put in order for test that Dr. Okey DupreEnd recommended at last office visit on 10/25/17. Patient is driving right now and unable to write down instructions. Will call patient back with instructions. Will have scheduling call patient with an appointment time as well. Patient verbalized understanding.

## 2017-11-18 ENCOUNTER — Telehealth (HOSPITAL_COMMUNITY): Payer: Self-pay | Admitting: *Deleted

## 2017-11-18 ENCOUNTER — Telehealth (HOSPITAL_COMMUNITY): Payer: Self-pay

## 2017-11-18 NOTE — Telephone Encounter (Signed)
Left message on voicemail in reference to upcoming appointment scheduled for 11/21/17. Phone number given for a call back so details instructions can be given. Lakenzie Mcclafferty, Adelene IdlerCynthia W

## 2017-11-18 NOTE — Telephone Encounter (Signed)
Patient given detailed instructions per Myocardial Perfusion Study Information Sheet for the test on 11/21/17 at 1230. Patient notified to arrive 15 minutes early and that it is imperative to arrive on time for appointment to keep from having the test rescheduled.  If you need to cancel or reschedule your appointment, please call the office within 24 hours of your appointment. . Patient verbalized understanding.TMY

## 2017-11-21 ENCOUNTER — Ambulatory Visit (HOSPITAL_COMMUNITY): Payer: Medicare HMO | Attending: Cardiovascular Disease

## 2017-11-21 DIAGNOSIS — E119 Type 2 diabetes mellitus without complications: Secondary | ICD-10-CM | POA: Diagnosis not present

## 2017-11-21 DIAGNOSIS — R079 Chest pain, unspecified: Secondary | ICD-10-CM | POA: Diagnosis not present

## 2017-11-21 DIAGNOSIS — I10 Essential (primary) hypertension: Secondary | ICD-10-CM | POA: Diagnosis not present

## 2017-11-21 DIAGNOSIS — I447 Left bundle-branch block, unspecified: Secondary | ICD-10-CM | POA: Diagnosis not present

## 2017-11-21 MED ORDER — TECHNETIUM TC 99M TETROFOSMIN IV KIT
32.3000 | PACK | Freq: Once | INTRAVENOUS | Status: AC | PRN
  Administered 2017-11-21: 32.3 via INTRAVENOUS
  Filled 2017-11-21: qty 33

## 2017-11-21 MED ORDER — REGADENOSON 0.4 MG/5ML IV SOLN
0.4000 mg | Freq: Once | INTRAVENOUS | Status: AC
  Administered 2017-11-21: 0.4 mg via INTRAVENOUS

## 2017-11-22 ENCOUNTER — Ambulatory Visit (HOSPITAL_COMMUNITY): Payer: Medicare HMO | Attending: Internal Medicine

## 2017-11-22 LAB — MYOCARDIAL PERFUSION IMAGING
CHL CUP NUCLEAR SRS: 9
CHL CUP NUCLEAR SSS: 11
CHL CUP RESTING HR STRESS: 90 {beats}/min
LHR: 0.37
LV dias vol: 93 mL (ref 62–150)
LV sys vol: 44 mL
NUC STRESS TID: 1.2
Peak HR: 109 {beats}/min
SDS: 2

## 2017-11-22 MED ORDER — TECHNETIUM TC 99M TETROFOSMIN IV KIT
30.6000 | PACK | Freq: Once | INTRAVENOUS | Status: AC | PRN
  Administered 2017-11-22: 30.6 via INTRAVENOUS
  Filled 2017-11-22: qty 31

## 2017-11-27 ENCOUNTER — Telehealth: Payer: Self-pay

## 2017-11-27 NOTE — Telephone Encounter (Signed)
Notes recorded by Sigurd Sosapp, Sultan Pargas, RN on 11/27/2017 at 9:53 AM EDT Informed patient of results/recommendations. He verbalized understanding. ------

## 2017-11-27 NOTE — Telephone Encounter (Signed)
-----   Message from Yvonne Kendallhristopher End, MD sent at 11/25/2017  9:11 PM EDT ----- Please let Mr. Steven Allen know that his stress test is abnormal.  While the findings could represent artifact due to his morbid obesity, there is concern for prior infarct.  Please have him f/u with an APP as his convenience to reassess his symptoms and discuss further workup (CTA versus cath).

## 2017-12-18 ENCOUNTER — Encounter: Payer: Self-pay | Admitting: Cardiology

## 2017-12-18 ENCOUNTER — Ambulatory Visit: Payer: Medicare HMO | Admitting: Cardiology

## 2017-12-18 VITALS — BP 118/70 | HR 79 | Ht 69.0 in | Wt 341.4 lb

## 2017-12-18 DIAGNOSIS — Z01810 Encounter for preprocedural cardiovascular examination: Secondary | ICD-10-CM

## 2017-12-18 DIAGNOSIS — R9439 Abnormal result of other cardiovascular function study: Secondary | ICD-10-CM | POA: Diagnosis not present

## 2017-12-18 MED ORDER — METOPROLOL TARTRATE 50 MG PO TABS: 50.0000 mg | ORAL_TABLET | Freq: Once | ORAL | 0 refills | Status: DC

## 2017-12-18 NOTE — Patient Instructions (Addendum)
Medication Instructions:  1. Your physician recommends that you continue on your current medications as directed. Please refer to the Current Medication list given to you today.   Labwork: TODAY BMET FOR CORONARY CT   Testing/Procedures: PLEASE SCHEDULE A CORONARY CT W/FFR [facility] D[facility]  Follow-Up: PENDING TEST RESULTS  Any Other Special Instructions Will Be Listed Below (If Applicable).  If you need a refill on your cardiac medications before your next appointment, please call your pharmacy.   Please [facility] of [facility] at xx:xx AM (30-45 minutes prior to test start time)  [facility] [address] [place], [place] [address] [phone]  Proceed to the [facility] Radiology Department (First Floor).  Please follow these instructions carefully (unless otherwise directed):  Hold all erectile dysfunction medications at least 48 hours prior to test.  On the Night Before the Test: . Drink plenty of water. . Do not consume any caffeinated/decaffeinated beverages or chocolate 12 hours prior to your test. . Do not take any antihistamines 12 hours prior to your test. . If you take Metformin do not take 24 hours prior to test. . If the patient has contrast allergy: ? Patient will need a prescription for Prednisone and very clear instructions (as follows): 1. Prednisone 50 mg - take 13 hours prior to test 2. Take another Prednisone 50 mg 7 hours prior to test 3. Take another Prednisone 50 mg 1 hour prior to test 4. Take Benadryl 50 mg 1 hour prior to test . Patient must complete all four doses of above prophylactic medications. . Patient will need a ride after test due to Benadryl.  On the Day of the Test: . Drink plenty of water. Do not drink any water within one hour of the test. . Do not eat any food 4 hours prior to the test. . You may take your regular medications prior to the test. . IF NOT ON A  BETA BLOCKER - Take 50 mg of lopressor (metoprolol) one hour before the test. . HOLD Furosemide morning of the test.  After the Test: . Drink plenty of water. . After receiving IV contrast, you may experience a mild flushed feeling. This is normal. . On occasion, you may experience a mild rash up to 24 hours after the test. This is not dangerous. If this occurs, you can take Benadryl 25 mg and increase your fluid intake. . If you experience trouble breathing, this can be serious. If it is severe call 911 IMMEDIATELY. If it is mild, please call our office. . If you take any of these medications: Glipizide/Metformin, Avandament, Glucavance, please do not take 48 hours after completing test.

## 2017-12-18 NOTE — Progress Notes (Signed)
12/18/2017 Marianne SofiaFranklin Kolle   Dec 13, 1960  [id number]  Primary Physician Rexene AlbertsEverhart, Susann GivensFranklin, PA Primary Cardiologist: Dr. Okey DupreEnd  Reason for Visit/CC: Preoperative Evaluation and Abnormal Stress Test  HPI: Mr. Steven Allen is a 57 y.o. male who was recently referred to Dr. Okey DupreEnd for preoperative cardiovascular risk assessment in anticipation of bariatric surgery at the request of Dr. Andrey CampanileWilson. He has a history of hypertension, hyperlipidemia, type 2 diabetes mellitus, morbid obesity, and incisional hernia repair.   Of note, he is wheelchair-bound and does not perform any physical activity. He is wheelchair-bound due to his hip pain, but notes that his hips cannot be intervened upon surgically until he loses weight.  He has struggled with his weight for many years and has been unable to drop below 330 pounds despite multiple diets. Mr. Steven Allen denies a history of cardiac disease or previous perioperative cardiovascular complications.  He has not had any chest pain, shortness of breath, palpitations, lightheadedness, or edema.  Dr. Okey DupreEnd ordered a nuclear stress test, which was performed 11/22/17 and was abnormal. There was a large size, moderate intensity mostly fixed apical apical septal/anterior/lateral perfusion defect, suggestive of scar with minimal peri-infarct ischemia. LVEF 53% with apical hypokinesis.  Study was reviewed by Dr. Okey DupreEnd. He felt that the findings could represent artifact due to his morbid obesity, however there was concern also for prior infarct. Dr. Okey DupreEnd advised he f/u again to reassess symptoms and to discuss further w/u (CTA vs cath).  Pt continues to deny any CP and no dyspnea, however is very sedentary as outlined above. Wheelchair dependent.   Cardiac Studies NST 11/22/17 Study Highlights     Nuclear stress EF: 53%.  No T wave inversion was noted during stress.  There was no ST segment deviation noted during stress.  Defect 1: There is a large defect of moderate severity.  Findings  consistent with prior myocardial infarction.  This is an intermediate risk study.   Large size, moderate intensity mostly-fixed apical, apical septal/anterior/lateral perfusion defect, suggestive of scar with minimal peri-infarct ischemia. LVEF 53% with apical hypokinesis. This is an intermediate risk study.       Current Meds  Medication Sig  . aspirin 81 MG chewable tablet Chew 81 mg by mouth daily.  Marland Kitchen. atorvastatin (LIPITOR) 20 MG tablet Take 20 mg by mouth daily.  . cyanocobalamin 1000 MCG tablet Take 1,000 mcg by mouth daily.  Marland Kitchen. losartan (COZAAR) 100 MG tablet Take 100 mg by mouth daily.  . metFORMIN (GLUCOPHAGE) 1000 MG tablet Take 1,000 mg by mouth 2 (two) times daily with a meal.  . methocarbamol (ROBAXIN) 750 MG tablet Take 750 mg by mouth daily.   . naproxen (NAPROSYN) 500 MG tablet Take 500 mg by mouth 2 (two) times daily with a meal.  . VITAMIN D, ERGOCALCIFEROL, PO Take by mouth as directed.    No Known Allergies Past Medical History:  Diagnosis Date  . Body mass index (BMI) 45.0-49.9, adult (HCC)   . Decreased strength of upper extremity   . Diabetes mellitus without complication (HCC)   . Exercise counseling   . Hyperlipidemia   . Hypertension   . Morbid obesity due to excess calories (HCC)   . Osteoarthritis of both hips    Family History  Problem Relation Age of Onset  . Cancer Other   . Hypertension Other   . Diabetes Other   . Cancer Father        Liver   Past Surgical History:  Procedure Laterality Date  .  CHOLECYSTECTOMY    . HERNIA REPAIR     Social History   Socioeconomic History  . Marital status: Single    Spouse name: Not on file  . Number of children: Not on file  . Years of education: Not on file  . Highest education level: Not on file  Occupational History  . Not on file  Social Needs  . Financial resource strain: Not on file  . Food insecurity:    Worry: Never true    Inability: Never true  . Transportation needs:    Medical:  Not on file    Non-medical: Not on file  Tobacco Use  . Smoking status: Never Smoker  . Smokeless tobacco: Never Used  Substance and Sexual Activity  . Alcohol use: Never    Frequency: Never  . Drug use: Never  . Sexual activity: Not on file    Comment: married  Lifestyle  . Physical activity:    Days per week: Not on file    Minutes per session: Not on file  . Stress: Not on file  Relationships  . Social connections:    Talks on phone: Not on file    Gets together: Not on file    Attends religious service: Not on file    Active member of club or organization: Not on file    Attends meetings of clubs or organizations: Not on file    Relationship status: Not on file  . Intimate partner violence:    Fear of current or ex partner: Not on file    Emotionally abused: Not on file    Physically abused: Not on file    Forced sexual activity: Not on file  Other Topics Concern  . Not on file  Social History Narrative  . Not on file     Review of Systems: General: negative for chills, fever, night sweats or weight changes.  Cardiovascular: negative for chest pain, dyspnea on exertion, edema, orthopnea, palpitations, paroxysmal nocturnal dyspnea or shortness of breath Dermatological: negative for rash Respiratory: negative for cough or wheezing Urologic: negative for hematuria Abdominal: negative for nausea, vomiting, diarrhea, bright red blood per rectum, melena, or hematemesis Neurologic: negative for visual changes, syncope, or dizziness All other systems reviewed and are otherwise negative except as noted above.   Physical Exam:  Blood pressure 118/70, pulse 79, height 5\' 9"  (1.753 m), weight (!) 341 lb 6.4 oz (154.9 kg), SpO2 96 %.  General appearance: alert, cooperative, no distress and morbidly obese Neck: no carotid bruit and no JVD Lungs: clear to auscultation bilaterally Heart: regular rate and rhythm, S1, S2 normal, no murmur, click, rub or gallop Extremities:  extremities normal, atraumatic, no cyanosis or edema Pulses: 2+ and symmetric Skin: Skin color, texture, turgor normal. No rashes or lesions Neurologic: Grossly normal  EKG not performed -- personally reviewed   ASSESSMENT AND PLAN:   1. Abnormal Stress Test/ Perioperative Assessment: stress test was intermediate risk as outlined above, and pt denies cardiac symptoms but not ambulatory due to severe bilateral hip OA. He is anticipating possible bariatric surgery, followed by hip replacement if cleared from a CV standpoint. While the findings of his stress test could represent artifact due to his morbid obesity, there is concern for prior infarct. He also has multiple cardiac risk factors including DM, HLD, HTN, male sex > 50 and obesity. Dr. Okey Dupre has recommended further w/u (CTA vs cath). I discussed coronary CTA with Dr. Delton See given his weight of 341 lb. His  height is 5'9. Dr. Delton SeeNelson feels that he is a suitable candidate for coronary CTA to define his coronary anatomy. Pt is in agreement with plan. If coronary CTA shows no obstructive CAD, then he can be cleared for surgery. We will f/u on results.     Follow-Up TBD based on coronary CTA.   Houston Zapien Delmer IslamSimmons PA-C, [facility] 12/18/2017 12:17 PM

## 2017-12-19 LAB — BASIC METABOLIC PANEL
BUN/Creatinine Ratio: 14 (ref 9–20)
BUN: 13 mg/dL (ref 6–24)
CO2: 21 mmol/L (ref 20–29)
Calcium: 9.7 mg/dL (ref 8.7–10.2)
Chloride: 101 mmol/L (ref 96–106)
Creatinine, Ser: 0.91 mg/dL (ref 0.76–1.27)
GFR calc Af Amer: 106 mL/min/{1.73_m2} (ref >59)
GFR calc non Af Amer: 92 mL/min/{1.73_m2} (ref >59)
GLUCOSE: 160 mg/dL — AB (ref 65–99)
Potassium: 4.1 mmol/L (ref 3.5–5.2)
SODIUM: 139 mmol/L (ref 134–144)

## 2017-12-20 ENCOUNTER — Other Ambulatory Visit: Payer: Self-pay | Admitting: *Deleted

## 2017-12-25 DIAGNOSIS — E785 Hyperlipidemia, unspecified: Secondary | ICD-10-CM | POA: Diagnosis not present

## 2017-12-25 DIAGNOSIS — E538 Deficiency of other specified B group vitamins: Secondary | ICD-10-CM | POA: Diagnosis not present

## 2017-12-25 DIAGNOSIS — Z7182 Exercise counseling: Secondary | ICD-10-CM | POA: Diagnosis not present

## 2017-12-25 DIAGNOSIS — Z7189 Other specified counseling: Secondary | ICD-10-CM | POA: Diagnosis not present

## 2017-12-25 DIAGNOSIS — R5381 Other malaise: Secondary | ICD-10-CM | POA: Diagnosis not present

## 2017-12-25 DIAGNOSIS — Z713 Dietary counseling and surveillance: Secondary | ICD-10-CM | POA: Diagnosis not present

## 2017-12-25 DIAGNOSIS — I10 Essential (primary) hypertension: Secondary | ICD-10-CM | POA: Diagnosis not present

## 2017-12-25 DIAGNOSIS — Z6841 Body Mass Index (BMI) 40.0 and over, adult: Secondary | ICD-10-CM | POA: Diagnosis not present

## 2017-12-25 DIAGNOSIS — E119 Type 2 diabetes mellitus without complications: Secondary | ICD-10-CM | POA: Diagnosis not present

## 2018-01-15 ENCOUNTER — Ambulatory Visit (HOSPITAL_COMMUNITY)
Admission: RE | Admit: 2018-01-15 | Discharge: 2018-01-15 | Disposition: A | Discharge status: Home / Self Care | Payer: Medicare HMO | Source: Ambulatory Visit | Attending: Cardiology | Admitting: Cardiology

## 2018-01-15 ENCOUNTER — Encounter (HOSPITAL_COMMUNITY): Payer: Self-pay

## 2018-01-15 DIAGNOSIS — R9439 Abnormal result of other cardiovascular function study: Secondary | ICD-10-CM

## 2018-01-15 DIAGNOSIS — I2510 Atherosclerotic heart disease of native coronary artery without angina pectoris: Secondary | ICD-10-CM | POA: Insufficient documentation

## 2018-01-15 DIAGNOSIS — Z01810 Encounter for preprocedural cardiovascular examination: Secondary | ICD-10-CM

## 2018-01-15 MED ORDER — NITROGLYCERIN 0.4 MG SL SUBL
0.8000 mg | SUBLINGUAL_TABLET | Freq: Once | SUBLINGUAL | Status: AC
  Filled 2018-01-15: qty 25

## 2018-01-15 MED ORDER — NITROGLYCERIN 0.4 MG SL SUBL
SUBLINGUAL_TABLET | SUBLINGUAL | Status: AC
  Administered 2018-01-15: 0.8 mg via SUBLINGUAL
  Filled 2018-01-15: qty 2

## 2018-01-15 MED ORDER — IOPAMIDOL (ISOVUE-370) INJECTION 76%
100.0000 mL | Freq: Once | INTRAVENOUS | Status: AC | PRN
  Administered 2018-01-15: 90 mL via INTRAVENOUS

## 2018-01-15 MED ORDER — METOPROLOL TARTRATE 5 MG/5ML IV SOLN
INTRAVENOUS | Status: AC
  Filled 2018-01-15: qty 15

## 2018-01-15 MED ORDER — METOPROLOL TARTRATE 5 MG/5ML IV SOLN
5.0000 mg | INTRAVENOUS | Status: DC | PRN
  Filled 2018-01-15: qty 5

## 2018-01-21 NOTE — Telephone Encounter (Signed)
Left message for pt to call back  °

## 2018-01-21 NOTE — Telephone Encounter (Signed)
   Primary Cardiologist: Yvonne Kendallhristopher End, MD  Chart reviewed as part of pre-operative protocol coverage. Given past medical history and time since last visit, based on ACC/AHA guidelines, Steven Allen is cleared for the planned procedure without further cardiovascular testing. Intermediate risk.   Coronary CTA was reviewed by Dr. Okey DupreEnd. Per result note, Given lack of symptoms and moderate 2-vessel CAD that is not hemodynamically significant by CTFFR, I think it is reasonable for Steven Allen to proceed with bariatric surgery without additional cardiac testing. Of note, this is an intermediate risk procedure from a cardiac standpoint. I recommend that he take ASA 81 mg daily (can be held for surgery) and continue atorvastatin (goal LDL < 70)"  I will route this recommendation to the requesting party via Epic fax function and remove from pre-op pool.   Please call with questions.  Robbie LisBrittainy Yehudah Standing, PA-C 01/21/2018, 2:33 PM

## 2018-01-22 DIAGNOSIS — R9439 Abnormal result of other cardiovascular function study: Secondary | ICD-10-CM | POA: Diagnosis not present

## 2018-01-23 NOTE — Telephone Encounter (Signed)
I will remove this from the pre op call back pool.

## 2018-01-23 NOTE — Telephone Encounter (Signed)
I s/w the pt today and let him know that he has been cleared for his surgery with Dr. Gaynelle Adu. I s/w Dr. Tawana Scale office to confirm if clearance letter was received. They did not see it, though were going to look further. I will re-fax clearance to Dr. Tawana Scale office.

## 2018-02-05 DIAGNOSIS — G8929 Other chronic pain: Secondary | ICD-10-CM | POA: Diagnosis not present

## 2018-02-05 DIAGNOSIS — M25511 Pain in right shoulder: Secondary | ICD-10-CM | POA: Diagnosis not present

## 2018-02-05 DIAGNOSIS — Z23 Encounter for immunization: Secondary | ICD-10-CM | POA: Diagnosis not present

## 2018-02-05 DIAGNOSIS — Z6841 Body Mass Index (BMI) 40.0 and over, adult: Secondary | ICD-10-CM | POA: Diagnosis not present

## 2018-02-05 DIAGNOSIS — Z7189 Other specified counseling: Secondary | ICD-10-CM | POA: Diagnosis not present

## 2018-02-05 DIAGNOSIS — M545 Low back pain: Secondary | ICD-10-CM | POA: Diagnosis not present

## 2018-02-05 DIAGNOSIS — M160 Bilateral primary osteoarthritis of hip: Secondary | ICD-10-CM | POA: Diagnosis not present

## 2018-02-05 DIAGNOSIS — Z7182 Exercise counseling: Secondary | ICD-10-CM | POA: Diagnosis not present

## 2018-02-05 DIAGNOSIS — Z713 Dietary counseling and surveillance: Secondary | ICD-10-CM | POA: Diagnosis not present

## 2018-02-05 DIAGNOSIS — R5381 Other malaise: Secondary | ICD-10-CM | POA: Diagnosis not present

## 2018-02-11 ENCOUNTER — Telehealth: Payer: Self-pay | Admitting: Skilled Nursing Facility1

## 2018-02-11 NOTE — Telephone Encounter (Signed)
Dietitian called pt to assess their understanding of the pre-op nutrition recommendations through the teach back method to ensure the pts knowledge readiness in preparation for surgery.    Pt states he wants to come into class again.  Dietitian scheduled him for the upcoming pre-op class.

## 2018-02-17 ENCOUNTER — Encounter: Payer: Self-pay | Admitting: Skilled Nursing Facility1

## 2018-02-17 ENCOUNTER — Encounter: Payer: Medicare HMO | Attending: General Surgery | Admitting: Skilled Nursing Facility1

## 2018-02-17 DIAGNOSIS — Z713 Dietary counseling and surveillance: Secondary | ICD-10-CM | POA: Diagnosis not present

## 2018-02-17 DIAGNOSIS — Z6841 Body Mass Index (BMI) 40.0 and over, adult: Secondary | ICD-10-CM | POA: Insufficient documentation

## 2018-02-17 DIAGNOSIS — E119 Type 2 diabetes mellitus without complications: Secondary | ICD-10-CM | POA: Insufficient documentation

## 2018-02-17 DIAGNOSIS — E669 Obesity, unspecified: Secondary | ICD-10-CM

## 2018-02-17 NOTE — Progress Notes (Signed)
Pre-Operative Nutrition Class:  Appt start time: 2751   End time:  1830.  Patient was seen on 02/17/2018 for Pre-Operative Bariatric Surgery Education at [facility].   Surgery date:  Surgery type: sleeve [facility] at [facility]: 360.6 Weight today: 332.4  Samples given per MNT protocol. Patient educated on appropriate usage:  Bariatric Advantage Multivitamin Lot #[id number] Exp: 10/20  Bariatric Advantage Calcium  Lot # [id number] Exp: August 11, 2018  [facility] Protein  Shake Lot # [id number] Exp: mar-11-2018 The following the learning objectives were met by the patient during this course:  Identify Pre-Op Dietary Goals and will begin 2 weeks pre-operatively  Identify appropriate sources of fluids and proteins   State protein recommendations and appropriate sources pre and post-operatively  Identify Post-Operative Dietary Goals and will follow for 2 weeks post-operatively  Identify appropriate multivitamin and calcium sources  Describe the need for physical activity post-operatively and will follow MD recommendations  State when to call healthcare provider regarding medication questions or post-operative complications  Handouts given during class include:  Pre-Op Bariatric Surgery Diet Handout  Protein Shake Handout  Post-Op Bariatric Surgery Nutrition Handout  BELT Program Information Flyer  Support Group Information Flyer  [facility] Bariatric Supplements Price List  [facility] Plan: [facility] diet advancement per MD.

## 2018-02-27 ENCOUNTER — Ambulatory Visit: Payer: Self-pay | Admitting: General Surgery

## 2018-02-27 DIAGNOSIS — I10 Essential (primary) hypertension: Secondary | ICD-10-CM | POA: Diagnosis not present

## 2018-02-27 DIAGNOSIS — Z7409 Other reduced mobility: Secondary | ICD-10-CM | POA: Diagnosis not present

## 2018-02-27 DIAGNOSIS — E7800 Pure hypercholesterolemia, unspecified: Secondary | ICD-10-CM | POA: Diagnosis not present

## 2018-02-27 DIAGNOSIS — M160 Bilateral primary osteoarthritis of hip: Secondary | ICD-10-CM | POA: Diagnosis not present

## 2018-02-27 DIAGNOSIS — E1169 Type 2 diabetes mellitus with other specified complication: Secondary | ICD-10-CM | POA: Diagnosis not present

## 2018-02-27 DIAGNOSIS — Z8719 Personal history of other diseases of the digestive system: Secondary | ICD-10-CM | POA: Diagnosis not present

## 2018-02-27 DIAGNOSIS — Z9889 Other specified postprocedural states: Secondary | ICD-10-CM | POA: Diagnosis not present

## 2018-02-27 NOTE — H&P (View-Only) (Signed)
Steven Allen Documented: 02/27/2018 8:53 AM Location: [facility] Patient #: [id number] DOB: [birth year] Married / Language: Steven Allen / Race: White Male  History of Present Illness Steven Allen M. Corbett Moulder MD; 02/27/2018 9:37 AM) The patient is a 57 year old male who presents for a bariatric surgery evaluation. He comes in today for preoperative appointment. I initially met him about 14 months ago. He denies any significant medical changes. He is accompanied by his wife. He underwent cardiology evaluation and underwent a cardiac CT. given his lack of symptoms and moderate 2 vessel disease the cardiologist found him to be intermediate risk. He denies any chest pain, chest pressure, source of breath, angina, TIAs or Steven Allen. He denies any heartburn or reflux. He denies any peripheral edema. He is essentially scooter chair bound. He only transfers from chair to bed and chair to shower etc. he rarely uses his walker. He has lost about 23 pounds since last year.  His upper GI was unremarkable. Chest x-ray was unremarkable.  Bariatric evaluation labs a year ago showed of B12 deficiency of 161. He is on daily B12. He also vitamin D deficiency at 18.8. He is on supplemental vitamin D as well. Otherwise his bariatric evaluation labs were unremarkable. In March he had a lipid panel with his PCP. Total cholesterol 144, HDL level 30, triglycerides 29    11/2016 He is referred by Steven Allen for evaluation of weight loss surgery. He is accompanied by his wife. He attended our seminar in person. He is interested in either a gastric bypass or sleeve gastrectomy but is leaning toward a sleeve gastrectomy. One of his goals with weight loss surgery is to be able to lose weight so he can undergo hip replacement surgery. He has end-stage bilateral hip osteoarthritis which severely impacts his mobility and he is pretty much confined to a scooter chair. He also wants it to help  with his blood sugars. Despite numerous attempts for sustained weight loss he is been unsuccessful. He has tried Weight Watchers, Atkins, protein shakes, intermittent fasting as well as discussing his weight with his primary care physician.  His comorbidities include diabetes mellitus type 2, hypertension, bilateral hip osteoarthritis  He denies any chest pain, chest pressure, shortness of breath, orthopnea, paroxysmal nocturnal dyspnea. He does not really ambulate. He denies any personal or family history of blood clots. He denies any peripheral edema. He states that he had a home sleep test which was +5 years ago and was prescribed CPAP but he does not use it. He states he never had an overnight sleep study in a facility. His STOP-BANG was 1 for STOP questions. He denies any reflux. He has had a laparoscopic cholecystectomy. He states he developed ventral hernia and underwent laparoscopic hernia repair at [facility] within the past 10 years. His bowel movement frequency ranges from daily to every other day. He denies melena, hematochezia. He reports a colonoscopy within the past 5 years which was normal. He denies any dysuria or hematuria.  He has bilateral hip pain and some pain that radiates down his legs. He denies any TIAs or amaurosis Allen. He denies any fainting episodes. He states his last hemoglobin A1c was around 7.1. He denies any tobacco use and rare alcohol consumption. He denies any drug use.   Problem List/Past Medical Steven Allen M. Redmond Pulling, MD; 02/27/2018 9:37 AM) SEVERE OBESITY (E66.01) I still think the patient is an acceptable candidate for weight loss surgery. He is slightly above risk. We discussed  his mobility issues increase his risk for perioperative blood clots. Using the mbsaqip risk calculator, we went over his perioperative risk. He was given a copy. Any complication 3.70%, serious complication 2.8%, leak 0.56%, bleeding 1.14%. We discussed the other  findings as well. We discussed the importance the preoperative meal plan. All their questions were asked and answered. Given his mobility issues, cardiac findings I still believe sleeve gastrectomy would be the safest procedure for him as opposed to gastric bypass LIMITED MOBILITY (Z74.09) HISTORY OF INCISIONAL HERNIA REPAIR (Z98.890)  Past Surgical History (Steven Allen M. Steven Ildefonso, MD; 02/27/2018 9:37 AM) Gallbladder Surgery - Laparoscopic  Diagnostic Studies History (Steven Allen M. Steven Swim, MD; 02/27/2018 9:37 AM) Colonoscopy 1-5 years ago  Allergies (Steven Allen, Allen; 02/27/2018 8:55 AM) No Known Allergies [02/27/2018]:  Medication History (Steven Allen, Allen; 02/27/2018 8:56 AM) Vitamin D (Ergocalciferol) (50000UNIT Capsule, Oral) Active. Vitamin B-12 (1000MCG Tablet, Oral) Active. Methocarbamol (750MG Tablet, Oral) Active. Aspirin Low Dose (81MG Tablet DR, Oral) Active. Naproxen (500MG Tablet, Oral) Active. Atorvastatin Calcium (20MG Tablet, Oral) Active. MetFORMIN HCl (1000MG Tablet, Oral) Active. Losartan Potassium (100MG Tablet, Oral) Active. Victoza (18MG/3ML Soln Pen-inj, Subcutaneous) Active. Medications Reconciled  Social History (Steven Allen Melamed, MD; 02/27/2018 9:37 AM) Alcohol use Occasional alcohol use. Caffeine use Coffee. No drug use Tobacco use Never smoker.  Family History (Steven Allen M. Steven Sookdeo, MD; 02/27/2018 9:37 AM) Cancer Father. Diabetes Mellitus Father, Mother. Hypertension Father, Mother.  Other Problems (Steven Allen M. Steven Tibbitts, MD; 02/27/2018 9:37 AM) Back Pain Enlarged Prostate DIABETES MELLITUS TYPE 2 IN OBESE (E11.69) ESSENTIAL HYPERTENSION (I10) BILATERAL PRIMARY OSTEOARTHRITIS OF HIP (M16.0) HYPERCHOLESTEROLEMIA (E78.00)     Review of Systems (Steven Allen M. Steven Hidrogo MD; 02/27/2018 9:33 AM) General Present- Night Sweats. Not Present- Appetite Loss, Chills, Fatigue, Fever, Weight Gain and Weight Loss. Skin Present- Dryness. Not Present- Change in  Wart/Mole, Hives, Jaundice, New Lesions, Non-Healing Wounds, Rash and Ulcer. HEENT Present- Seasonal Allergies and Wears glasses/contact lenses. Not Present- Earache, Hearing Loss, Hoarseness, Nose Bleed, Oral Ulcers, Ringing in the Ears, Sinus Pain, Sore Throat, Visual Disturbances and Yellow Eyes. Breast Not Present- Breast Mass, Breast Pain, Nipple Discharge and Skin Changes. Cardiovascular Not Present- Chest Pain, Difficulty Breathing Lying Down, Leg Cramps, Palpitations, Rapid Heart Rate, Shortness of Breath and Swelling of Extremities. Gastrointestinal Present- Bloating. Not Present- Abdominal Pain, Bloody Stool, Change in Bowel Habits, Chronic diarrhea, Constipation, Difficulty Swallowing, Excessive gas, Gets full quickly at meals, Hemorrhoids, Indigestion, Nausea, Rectal Pain and Vomiting. Male Genitourinary Present- Nocturia. Not Present- Blood in Urine, Change in Urinary Stream, Frequency, Impotence, Painful Urination, Urgency and Urine Leakage. Musculoskeletal Present- Back Pain, Joint Pain, Joint Stiffness, Muscle Pain and Muscle Weakness. Not Present- Swelling of Extremities. Neurological Present- Numbness and Trouble walking. Not Present- Decreased Memory, Fainting, Headaches, Seizures, Tingling, Tremor and Weakness. Hematology Not Present- Blood Thinners, Easy Bruising, Excessive bleeding, Gland problems, HIV and Persistent Infections. All other systems negative  Vitals (Steven Allen; 02/27/2018 8:54 AM) 02/27/2018 8:53 AM Weight: 334.25 lb Height: 69in Body Surface Area: 2.57 m Body Mass Index: 49.36 kg/m  Temp.: 97.8F  Pulse: 83 (Regular)  P.OX: 97% (Room air) BP: 128/82 (Sitting, Left Arm, Standard)      Physical Exam (Steven Arechiga M. Milinda Sweeney MD; 02/27/2018 9:33 AM)  General Mental Status-Alert. General Appearance-Consistent with stated age. Hydration-Well hydrated. Voice-Normal. Note: morbidly obese, mainly central; sitting in scooter  chair  Integumentary Note: no rash/edema  Head and Neck Head-normocephalic, atraumatic with no lesions or palpable masses. Trachea-midline. Thyroid Gland Characteristics - normal size and   consistency.  Eye Eyeball - Bilateral-Extraocular movements intact. Sclera/Conjunctiva - Bilateral-No scleral icterus.  ENMT Note: ears - normal external pinna mouth - lips intact  Chest and Lung Exam Chest and lung exam reveals -quiet, even and easy respiratory effort with no use of accessory muscles and on auscultation, normal breath sounds, no adventitious sounds and normal vocal resonance. Inspection Chest Wall - Normal. Back - normal.  Breast - Did not examine.  Cardiovascular Cardiovascular examination reveals -normal heart sounds, regular rate and rhythm with no murmurs and normal pedal pulses bilaterally.  Abdomen Inspection Inspection of the abdomen reveals - No Hernias. Skin - Scar - Note: several well healed trocar scars. Palpation/Percussion Palpation and Percussion of the abdomen reveal - Soft, Non Tender, No Rebound tenderness, No Rigidity (guarding) and No hepatosplenomegaly. Auscultation Auscultation of the abdomen reveals - Bowel sounds normal.  Peripheral Vascular Upper Extremity Palpation - Pulses bilaterally normal.  Neurologic Neurologic evaluation reveals -alert and oriented x 3 with no impairment of recent or remote memory. Mental Status-Normal.  Neuropsychiatric The patient's mood and affect are described as -normal. Judgment and Insight-insight is appropriate concerning matters relevant to self.  Musculoskeletal Normal Exam - Left-Upper Extremity Strength Normal and Lower Extremity Strength Normal. Normal Exam - Right-Upper Extremity Strength Normal and Lower Extremity Strength Normal.  Lymphatic Head & Neck  General Head & Neck Lymphatics: Bilateral - Description - Normal. Axillary - Did not examine. Femoral & Inguinal -  Did not examine.    Assessment & Plan Steven Allen M. Shawnita Krizek MD; 02/27/2018 9:38 AM)  SEVERE OBESITY (E66.01) Story: I still think the patient is an acceptable candidate for weight loss surgery. He is slightly above risk. We discussed his mobility issues increase his risk for perioperative blood clots. Using the mbsaqip risk calculator, we went over his perioperative risk. He was given a copy. Any complication 8.50%, serious complication 2.7%, leak 7.41%, bleeding 1.14%. We discussed the other findings as well. We discussed the importance the preoperative meal plan. All their questions were asked and answered. Given his mobility issues, cardiac findings I still believe sleeve gastrectomy would be the safest procedure for him as opposed to gastric bypass Impression: The patient meets weight loss surgery criteria. I think the patient would be an acceptable candidate for Laparoscopic vertical sleeve gastrectomy.  We rediscussed LSG. We discussed the preoperative, operative and postoperative process. Using diagrams, I reexplained the surgery in detail including the performance of an EGD near the end of the surgery. We rediscussed the typical hospital course including a 2-3 day stay baring any complications. The patient was given educational material. I quoted the patient that most patients can lose up to 50-55% of their excess weight. We did discuss the possibility of weight regain several years after the procedure.  The risks of infection, bleeding, pain, scarring, weight regain, too little or too much weight loss, vitamin deficiencies and need for lifelong vitamin supplementation, hair loss, need for protein supplementation, leaks, stricture, reflux, food intolerance, gallstone formation, hernia, need for reoperation, need for open surgery, injury to spleen or surrounding structures, DVT's, PE, and death again discussed with the patient and the patient expressed understanding and desires to proceed with  laparoscopic vertical sleeve gastrectomy, possible open, intraoperative endoscopy.  We discussed that before and after surgery that there would be an alteration in their diet. I explained that we have put them on a diet 2 weeks before surgery. I also explained that they would be on a liquid diet for 2 weeks after  surgery. We discussed that they would have to avoid certain foods after surgery. We discussed the importance of physical activity as well as compliance with our dietary and supplement recommendations and routine follow-up.  congratulated him on the 23lb weight loss since last year  Current Plans Pt Education - EMW_preopbariatric  HISTORY OF [place] [phone]) Impression: I reviewed his outside operative note. He had a laparoscopic ventral incisional hernia repair. He had omentum to the anterior abdominal wall. They used a 12 cm parietex piece of mesh.   LIMITED MOBILITY (Z74.09) Impression: Because of his limited mobility I advised him that should he be appropriate for weight loss surgery he would go home on extended chemical DVT prophylaxis because of his increased risk of venous thromboembolism.   BILATERAL PRIMARY OSTEOARTHRITIS OF HIP (M16.0) Impression: We also discussed the differences between gastric bypass and sleeve gastrectomy. Because of his need for NSAIDs with respect to his osteoarthritis I recommended a sleeve gastrectomy in order to decrease his risk of marginal ulcer formation   ESSENTIAL HYPERTENSION (I10) Impression: We discussed that the cardiologist found him to be at intermediate risk. We discussed this implication. He wishes to proceed with weight loss surgery.   DIABETES MELLITUS TYPE 2 IN OBESE (E11.69)   HYPERCHOLESTEROLEMIA (E78.00)  Leighton Ruff. Redmond Pulling, MD, FACS General, Bariatric, & Minimally Invasive Surgery [facility], [place]

## 2018-02-27 NOTE — H&P (Signed)
Alexis Frock Documented: 02/27/2018 8:53 AM Location: [facility] Patient #: [id number] DOB: 02-19-1961 Married / Language: [name] / Race: White Male  History of Present Illness Randall Hiss M. Lamark Schue MD; 02/27/2018 9:37 AM) The patient is a 57 year old male who presents for a bariatric surgery evaluation. He comes in today for preoperative appointment. I initially met him about 14 months ago. He denies any significant medical changes. He is accompanied by his wife. He underwent cardiology evaluation and underwent a cardiac CT. given his lack of symptoms and moderate 2 vessel disease the cardiologist found him to be intermediate risk. He denies any chest pain, chest pressure, source of breath, angina, TIAs or [name]. He denies any heartburn or reflux. He denies any peripheral edema. He is essentially scooter chair bound. He only transfers from chair to bed and chair to shower etc. he rarely uses his walker. He has lost about 23 pounds since last year.  His upper GI was unremarkable. Chest x-ray was unremarkable.  Bariatric evaluation labs a year ago showed of B12 deficiency of 161. He is on daily B12. He also vitamin D deficiency at 18.8. He is on supplemental vitamin D as well. Otherwise his bariatric evaluation labs were unremarkable. In March he had a lipid panel with his PCP. Total cholesterol 144, HDL level 30, triglycerides 29    11/2016 He is referred by [name] PA for evaluation of weight loss surgery. He is accompanied by his wife. He attended our seminar in person. He is interested in either a gastric bypass or sleeve gastrectomy but is leaning toward a sleeve gastrectomy. One of his goals with weight loss surgery is to be able to lose weight so he can undergo hip replacement surgery. He has end-stage bilateral hip osteoarthritis which severely impacts his mobility and he is pretty much confined to a scooter chair. He also wants it to help  with his blood sugars. Despite numerous attempts for sustained weight loss he is been unsuccessful. He has tried Weight Watchers, Atkins, protein shakes, intermittent fasting as well as discussing his weight with his primary care physician.  His comorbidities include diabetes mellitus type 2, hypertension, bilateral hip osteoarthritis  He denies any chest pain, chest pressure, shortness of breath, orthopnea, paroxysmal nocturnal dyspnea. He does not really ambulate. He denies any personal or family history of blood clots. He denies any peripheral edema. He states that he had a home sleep test which was +5 years ago and was prescribed CPAP but he does not use it. He states he never had an overnight sleep study in a facility. His STOP-BANG was 1 for STOP questions. He denies any reflux. He has had a laparoscopic cholecystectomy. He states he developed ventral hernia and underwent laparoscopic hernia repair at [facility] within the past 10 years. His bowel movement frequency ranges from daily to every other day. He denies melena, hematochezia. He reports a colonoscopy within the past 5 years which was normal. He denies any dysuria or hematuria.  He has bilateral hip pain and some pain that radiates down his legs. He denies any TIAs or amaurosis fugax. He denies any fainting episodes. He states his last hemoglobin A1c was around 7.1. He denies any tobacco use and rare alcohol consumption. He denies any drug use.   Problem List/Past Medical Randall Hiss M. Redmond Pulling, MD; 02/27/2018 9:37 AM) SEVERE OBESITY (E66.01) I still think the patient is an acceptable candidate for weight loss surgery. He is slightly above risk. We discussed  his mobility issues increase his risk for perioperative blood clots. Using the mbsaqip risk calculator, we went over his perioperative risk. He was given a copy. Any complication 7.61%, serious complication 9.5%, leak 0.93%, bleeding 1.14%. We discussed the other  findings as well. We discussed the importance the preoperative meal plan. All their questions were asked and answered. Given his mobility issues, cardiac findings I still believe sleeve gastrectomy would be the safest procedure for him as opposed to gastric bypass LIMITED MOBILITY (Z74.09) HISTORY OF INCISIONAL HERNIA REPAIR (O67.124)  Past Surgical History Randall Hiss M. Redmond Pulling, MD; 02/27/2018 9:37 AM) Gallbladder Surgery - Laparoscopic  Diagnostic Studies History Randall Hiss M. Redmond Pulling, MD; 02/27/2018 9:37 AM) Colonoscopy 1-5 years ago  Allergies Andreas Blower, [place]; 02/27/2018 8:55 AM) No Known Allergies [02/27/2018]:  Medication History (Armen Ferguson, CMA; 02/27/2018 8:56 AM) Vitamin D (Ergocalciferol) (50000UNIT Capsule, Oral) Active. Vitamin B-12 (1000MCG Tablet, Oral) Active. Methocarbamol (750MG Tablet, Oral) Active. Aspirin Low Dose (81MG Tablet DR, Oral) Active. Naproxen (500MG Tablet, Oral) Active. Atorvastatin Calcium (20MG Tablet, Oral) Active. MetFORMIN HCl (1000MG Tablet, Oral) Active. Losartan Potassium (100MG Tablet, Oral) Active. Victoza (18MG/3ML Soln Pen-inj, Subcutaneous) Active. Medications Reconciled  Social History Randall Hiss M. Redmond Pulling, MD; 02/27/2018 9:37 AM) Alcohol use Occasional alcohol use. Caffeine use Coffee. No drug use Tobacco use Never smoker.  Family History Randall Hiss M. Redmond Pulling, MD; 02/27/2018 9:37 AM) Cancer Father. Diabetes Mellitus Father, Mother. Hypertension Father, Mother.  Other Problems Randall Hiss M. Redmond Pulling, MD; 02/27/2018 9:37 AM) Back Pain Enlarged Prostate DIABETES MELLITUS TYPE 2 IN OBESE (E11.69) ESSENTIAL HYPERTENSION (I10) BILATERAL PRIMARY OSTEOARTHRITIS OF HIP (M16.0) HYPERCHOLESTEROLEMIA (E78.00)     Review of Systems Randall Hiss M.  MD; 02/27/2018 9:33 AM) General Present- Night Sweats. Not Present- Appetite Loss, Chills, Fatigue, Fever, Weight Gain and Weight Loss. Skin Present- Dryness. Not Present- Change in  Wart/Mole, Hives, Jaundice, New Lesions, Non-Healing Wounds, Rash and Ulcer. HEENT Present- Seasonal Allergies and Wears glasses/contact lenses. Not Present- Earache, Hearing Loss, Hoarseness, Nose Bleed, Oral Ulcers, Ringing in the Ears, Sinus Pain, Sore Throat, Visual Disturbances and Yellow Eyes. Breast Not Present- Breast Mass, Breast Pain, Nipple Discharge and Skin Changes. Cardiovascular Not Present- Chest Pain, Difficulty Breathing Lying Down, Leg Cramps, Palpitations, Rapid Heart Rate, Shortness of Breath and Swelling of Extremities. Gastrointestinal Present- Bloating. Not Present- Abdominal Pain, Bloody Stool, Change in Bowel Habits, Chronic diarrhea, Constipation, Difficulty Swallowing, Excessive gas, Gets full quickly at meals, Hemorrhoids, Indigestion, Nausea, Rectal Pain and Vomiting. Male Genitourinary Present- Nocturia. Not Present- Blood in Urine, Change in Urinary Stream, Frequency, Impotence, Painful Urination, Urgency and Urine Leakage. Musculoskeletal Present- Back Pain, Joint Pain, Joint Stiffness, Muscle Pain and Muscle Weakness. Not Present- Swelling of Extremities. Neurological Present- Numbness and Trouble walking. Not Present- Decreased Memory, Fainting, Headaches, Seizures, Tingling, Tremor and Weakness. Hematology Not Present- Blood Thinners, Easy Bruising, Excessive bleeding, Gland problems, HIV and Persistent Infections. All other systems negative  Vitals (Armen Ferguson CMA; 02/27/2018 8:54 AM) 02/27/2018 8:53 AM Weight: 334.25 lb Height: 69in Body Surface Area: 2.57 m Body Mass Index: 49.36 kg/m  Temp.: 97.31F  Pulse: 83 (Regular)  P.OX: 97% (Room air) BP: 128/82 (Sitting, Left Arm, Standard)      Physical Exam Randall Hiss M.  MD; 02/27/2018 9:33 AM)  General Mental Status-Alert. General Appearance-Consistent with stated age. Hydration-Well hydrated. Voice-Normal. Note: morbidly obese, mainly central; sitting in scooter  chair  Integumentary Note: no rash/edema  Head and Neck Head-normocephalic, atraumatic with no lesions or palpable masses. Trachea-midline. Thyroid Gland Characteristics - normal size and  consistency.  Eye Eyeball - Bilateral-Extraocular movements intact. Sclera/Conjunctiva - Bilateral-No scleral icterus.  ENMT Note: ears - normal external pinna mouth - lips intact  Chest and Lung Exam Chest and lung exam reveals -quiet, even and easy respiratory effort with no use of accessory muscles and on auscultation, normal breath sounds, no adventitious sounds and normal vocal resonance. Inspection Chest Wall - Normal. Back - normal.  Breast - Did not examine.  Cardiovascular Cardiovascular examination reveals -normal heart sounds, regular rate and rhythm with no murmurs and normal pedal pulses bilaterally.  Abdomen Inspection Inspection of the abdomen reveals - No Hernias. Skin - Scar - Note: several well healed trocar scars. Palpation/Percussion Palpation and Percussion of the abdomen reveal - Soft, Non Tender, No Rebound tenderness, No Rigidity (guarding) and No hepatosplenomegaly. Auscultation Auscultation of the abdomen reveals - Bowel sounds normal.  Peripheral Vascular Upper Extremity Palpation - Pulses bilaterally normal.  Neurologic Neurologic evaluation reveals -alert and oriented x 3 with no impairment of recent or remote memory. Mental Status-Normal.  Neuropsychiatric The patient's mood and affect are described as -normal. Judgment and Insight-insight is appropriate concerning matters relevant to self.  Musculoskeletal Normal Exam - Left-Upper Extremity Strength Normal and Lower Extremity Strength Normal. Normal Exam - Right-Upper Extremity Strength Normal and Lower Extremity Strength Normal.  Lymphatic Head & Neck  General Head & Neck Lymphatics: Bilateral - Description - Normal. Axillary - Did not examine. Femoral & Inguinal -  Did not examine.    Assessment & Plan Randall Hiss M. Gudrun Axe MD; 02/27/2018 9:38 AM)  SEVERE OBESITY (E66.01) Story: I still think the patient is an acceptable candidate for weight loss surgery. He is slightly above risk. We discussed his mobility issues increase his risk for perioperative blood clots. Using the mbsaqip risk calculator, we went over his perioperative risk. He was given a copy. Any complication 4.17%, serious complication 4.0%, leak 8.14%, bleeding 1.14%. We discussed the other findings as well. We discussed the importance the preoperative meal plan. All their questions were asked and answered. Given his mobility issues, cardiac findings I still believe sleeve gastrectomy would be the safest procedure for him as opposed to gastric bypass Impression: The patient meets weight loss surgery criteria. I think the patient would be an acceptable candidate for Laparoscopic vertical sleeve gastrectomy.  We rediscussed LSG. We discussed the preoperative, operative and postoperative process. Using diagrams, I reexplained the surgery in detail including the performance of an EGD near the end of the surgery. We rediscussed the typical hospital course including a 2-3 day stay baring any complications. The patient was given educational material. I quoted the patient that most patients can lose up to 50-55% of their excess weight. We did discuss the possibility of weight regain several years after the procedure.  The risks of infection, bleeding, pain, scarring, weight regain, too little or too much weight loss, vitamin deficiencies and need for lifelong vitamin supplementation, hair loss, need for protein supplementation, leaks, stricture, reflux, food intolerance, gallstone formation, hernia, need for reoperation, need for open surgery, injury to spleen or surrounding structures, DVT's, PE, and death again discussed with the patient and the patient expressed understanding and desires to proceed with  laparoscopic vertical sleeve gastrectomy, possible open, intraoperative endoscopy.  We discussed that before and after surgery that there would be an alteration in their diet. I explained that we have put them on a diet 2 weeks before surgery. I also explained that they would be on a liquid diet for 2 weeks after  surgery. We discussed that they would have to avoid certain foods after surgery. We discussed the importance of physical activity as well as compliance with our dietary and supplement recommendations and routine follow-up.  congratulated him on the 23lb weight loss since last year  Current Plans Pt Education - EMW_preopbariatric  HISTORY OF [place] [phone]) Impression: I reviewed his outside operative note. He had a laparoscopic ventral incisional hernia repair. He had omentum to the anterior abdominal wall. They used a 12 cm parietex piece of mesh.   LIMITED MOBILITY (Z74.09) Impression: Because of his limited mobility I advised him that should he be appropriate for weight loss surgery he would go home on extended chemical DVT prophylaxis because of his increased risk of venous thromboembolism.   BILATERAL PRIMARY OSTEOARTHRITIS OF HIP (M16.0) Impression: We also discussed the differences between gastric bypass and sleeve gastrectomy. Because of his need for NSAIDs with respect to his osteoarthritis I recommended a sleeve gastrectomy in order to decrease his risk of marginal ulcer formation   ESSENTIAL HYPERTENSION (I10) Impression: We discussed that the cardiologist found him to be at intermediate risk. We discussed this implication. He wishes to proceed with weight loss surgery.   DIABETES MELLITUS TYPE 2 IN OBESE (E11.69)   HYPERCHOLESTEROLEMIA (E78.00)  Leighton Ruff. Redmond Pulling, MD, FACS General, Bariatric, & Minimally Invasive Surgery [facility], [place]

## 2018-03-03 NOTE — Patient Instructions (Addendum)
Steven Allen  Mar 07, 1961     Your procedure is scheduled on:  03-17-2018    Report to [facility] Main  Entrance, Report to admitting at  11:15 AM     Call this number if you have problems the morning of surgery [phone]      Remember: NO SOLID FOOD AFTER MIDNIGHT THE NIGHT PRIOR TO SURGERY. NOTHING BY MOUTH EXCEPT CLEAR LIQUIDS UNTIL 3 HOURS PRIOR TO                                      SCHEULED SURGERY. PLEASE FINISH ENSURE DRINK PER SURGEON ORDER 3 HOURS PRIOR TO SCHEDULED SURGERY TIME WHICH NEEDS                                     TO BE COMPLETED AT ____10:15 AM______.                                      BRUSH YOUR TEETH MORNING OF SURGERY AND RINSE YOUR MOUTH OUT, NO CHEWING GUM CANDY OR MINTS.     Take these medicines the morning of surgery with A SIP OF WATER:  NONE                DO NOT TAKE ANY DIABETIC MEDICATIONS DAY OF YOUR SURGERY                                 You may not have any metal on your body including hair pins and piercings              Do not wear jewelry, make-up, lotions, powders or perfumes, deodorant              Men may shave face and neck.       Do not bring valuables to the hospital. [place] IS NOT             RESPONSIBLE   FOR VALUABLES.  Contacts, dentures or bridgework may not be worn into surgery.  Leave suitcase in the car. After surgery it may be brought to your room.      Special Instructions: N/A   _____________________________________________________________________             [facility] - Preparing for Surgery Before surgery, you can play an important role.  Because skin is not sterile, your skin needs to be as free of germs as possible.  You can reduce the number of germs on your skin by washing with CHG (chlorahexidine gluconate) soap before surgery.  CHG is an antiseptic cleaner which kills germs and bonds with the skin to continue killing germs even after washing. Please  DO NOT use if you have an allergy to CHG or antibacterial soaps.  If your skin becomes reddened/irritated stop using the CHG and inform your nurse when you arrive at Short Stay. Do not shave (including legs and underarms) for at least 48 hours prior to the first CHG shower.  You may shave your face/neck. Please follow these instructions carefully:  1.  Shower with CHG Soap the night  before surgery and the  morning of Surgery.  2.  If you choose to wash your hair, wash your hair first as usual with your  normal  shampoo.  3.  After you shampoo, rinse your hair and body thoroughly to remove the  shampoo.                            4.  Use CHG as you would any other liquid soap.  You can apply chg directly  to the skin and wash                       Gently with a scrungie or clean washcloth.  5.  Apply the CHG Soap to your body ONLY FROM THE NECK DOWN.   Do not use on face/ open                           Wound or open sores. Avoid contact with eyes, ears mouth and genitals (private parts).                       Wash face,  Genitals (private parts) with your normal soap.             6.  Wash thoroughly, paying special attention to the area where your surgery  will be performed.  7.  Thoroughly rinse your body with warm water from the neck down.  8.  DO NOT shower/wash with your normal soap after using and rinsing off  the CHG Soap.             9.  Pat yourself dry with a clean towel.            10.  Wear clean pajamas.            11.  Place clean sheets on your bed the night of your first shower and do not  sleep with pets. Day of Surgery : Do not apply any lotions/deodorants the morning of surgery.  Please wear clean clothes to the hospital/surgery center.  FAILURE TO FOLLOW THESE INSTRUCTIONS MAY RESULT IN THE CANCELLATION OF YOUR SURGERY PATIENT SIGNATURE_________________________________  NURSE  SIGNATURE__________________________________  ________________________________________________________________________   Steven Allen  An incentive spirometer is a tool that can help keep your lungs clear and active. This tool measures how well you are filling your lungs with each breath. Taking long deep breaths may help reverse or decrease the chance of developing breathing (pulmonary) problems (especially infection) following:  A long period of time when you are unable to move or be active. BEFORE THE PROCEDURE   If the spirometer includes an indicator to show your best effort, your nurse or respiratory therapist will set it to a desired goal.  If possible, sit up straight or lean slightly forward. Try not to slouch.  Hold the incentive spirometer in an upright position. INSTRUCTIONS FOR USE  1. Sit on the edge of your bed if possible, or sit up as far as you can in bed or on a chair. 2. Hold the incentive spirometer in an upright position. 3. Breathe out normally. 4. Place the mouthpiece in your mouth and seal your lips tightly around it. 5. Breathe in slowly and as deeply as possible, raising the piston or the ball toward the top of the column. 6. Hold your breath for 3-5 seconds or for as  long as possible. Allow the piston or ball to fall to the bottom of the column. 7. Remove the mouthpiece from your mouth and breathe out normally. 8. Rest for a few seconds and repeat Steps 1 through 7 at least 10 times every 1-2 hours when you are awake. Take your time and take a few normal breaths between deep breaths. 9. The spirometer may include an indicator to show your best effort. Use the indicator as a goal to work toward during each repetition. 10. After each set of 10 deep breaths, practice coughing to be sure your lungs are clear. If you have an incision (the cut made at the time of surgery), support your incision when coughing by placing a pillow or rolled up towels firmly  against it. Once you are able to get out of bed, walk around indoors and cough well. You may stop using the incentive spirometer when instructed by your caregiver.  RISKS AND COMPLICATIONS  Take your time so you do not get dizzy or light-headed.  If you are in pain, you may need to take or ask for pain medication before doing incentive spirometry. It is harder to take a deep breath if you are having pain. AFTER USE  Rest and breathe slowly and easily.  It can be helpful to keep track of a log of your progress. Your caregiver can provide you with a simple table to help with this. If you are using the spirometer at home, follow these instructions: [place] IF:   You are having difficultly using the spirometer.  You have trouble using the spirometer as often as instructed.  Your pain medication is not giving enough relief while using the spirometer.  You develop fever of 100.5 F (38.1 C) or higher. SEEK IMMEDIATE MEDICAL CARE IF:   You cough up bloody sputum that had not been present before.  You develop fever of 102 F (38.9 C) or greater.  You develop worsening pain at or near the incision site. MAKE SURE YOU:   Understand these instructions.  Will watch your condition.  Will get help right away if you are not doing well or get worse. Document Released: 08/27/2006 Document Revised: 07/09/2011 Document Reviewed: 10/28/2006 ExitCare Patient Information 2014 ExitCare, [place].   ________________________________________________________________________  WHAT IS A BLOOD TRANSFUSION? Blood Transfusion Information  A transfusion is the replacement of blood or some of its parts. Blood is made up of multiple cells which provide different functions.  Red blood cells carry oxygen and are used for blood loss replacement.  White blood cells fight against infection.  Platelets control bleeding.  Plasma helps clot blood.  Other blood products are available for  specialized needs, such as hemophilia or other clotting disorders. BEFORE THE TRANSFUSION  Who gives blood for transfusions?   Healthy volunteers who are fully evaluated to make sure their blood is safe. This is blood bank blood. Transfusion therapy is the safest it has ever been in the practice of medicine. Before blood is taken from a donor, a complete history is taken to make sure that person has no history of diseases nor engages in risky social behavior (examples are intravenous drug use or sexual activity with multiple partners). The donor's travel history is screened to minimize risk of transmitting infections, such as malaria. The donated blood is tested for signs of infectious diseases, such as HIV and hepatitis. The blood is then tested to be sure it is compatible with you in order to minimize the chance  of a transfusion reaction. If you or a relative donates blood, this is often done in anticipation of surgery and is not appropriate for emergency situations. It takes many days to process the donated blood. RISKS AND COMPLICATIONS Although transfusion therapy is very safe and saves many lives, the main dangers of transfusion include:   Getting an infectious disease.  Developing a transfusion reaction. This is an allergic reaction to something in the blood you were given. Every precaution is taken to prevent this. The decision to have a blood transfusion has been considered carefully by your caregiver before blood is given. Blood is not given unless the benefits outweigh the risks. AFTER THE TRANSFUSION  Right after receiving a blood transfusion, you will usually feel much better and more energetic. This is especially true if your red blood cells have gotten low (anemic). The transfusion raises the level of the red blood cells which carry oxygen, and this usually causes an energy increase.  The nurse administering the transfusion will monitor you carefully for complications. HOME CARE  INSTRUCTIONS  No special instructions are needed after a transfusion. You may find your energy is better. Speak with your caregiver about any limitations on activity for underlying diseases you may have. SEEK MEDICAL CARE IF:   Your condition is not improving after your transfusion.  You develop redness or irritation at the intravenous (IV) site. SEEK IMMEDIATE MEDICAL CARE IF:  Any of the following symptoms occur over the next 12 hours:  Shaking chills.  You have a temperature by mouth above 102 F (38.9 C), not controlled by medicine.  Chest, back, or muscle pain.  People around you feel you are not acting correctly or are confused.  Shortness of breath or difficulty breathing.  Dizziness and fainting.  You get a rash or develop hives.  You have a decrease in urine output.  Your urine turns a dark color or changes to pink, red, or brown. Any of the following symptoms occur over the next 10 days:  You have a temperature by mouth above 102 F (38.9 C), not controlled by medicine.  Shortness of breath.  Weakness after normal activity.  The white part of the eye turns yellow (jaundice).  You have a decrease in the amount of urine or are urinating less often.  Your urine turns a dark color or changes to pink, red, or brown. Document Released: 04/13/2000 Document Revised: 07/09/2011 Document Reviewed: 12/01/2007 ExitCare Patient Information 2014 ExitCare, [place].  _______________________________________________________________________              CLEAR LIQUID DIET   Foods Allowed                                                                     Foods Excluded  Coffee and tea, regular and decaf                             liquids that you cannot  Plain Jell-O in any flavor  see through such as: Fruit ices (not with fruit pulp)                                     milk, soups, orange juice  Iced Popsicles                                     All solid food Carbonated beverages, regular and diet                                    Cranberry, grape and apple juices Sports drinks like Gatorade Lightly seasoned clear broth or consume(fat free) Sugar, honey syrup  Sample Menu Breakfast                                Lunch                                     Supper Cranberry juice                    Beef broth                            Chicken broth Jell-O                                     Grape juice                           Apple juice Coffee or tea                        Jell-O                                      Popsicle                                                Coffee or tea                        Coffee or tea  _____________________________________________________________________

## 2018-03-07 ENCOUNTER — Encounter (HOSPITAL_COMMUNITY)
Admission: RE | Admit: 2018-03-07 | Discharge: 2018-03-07 | Disposition: A | Discharge status: Home / Self Care | Payer: Medicare HMO | Source: Ambulatory Visit | Attending: General Surgery | Admitting: General Surgery

## 2018-03-07 ENCOUNTER — Encounter (HOSPITAL_COMMUNITY): Payer: Self-pay

## 2018-03-07 ENCOUNTER — Other Ambulatory Visit: Payer: Self-pay

## 2018-03-07 DIAGNOSIS — Z6841 Body Mass Index (BMI) 40.0 and over, adult: Secondary | ICD-10-CM | POA: Diagnosis not present

## 2018-03-07 DIAGNOSIS — Z01812 Encounter for preprocedural laboratory examination: Secondary | ICD-10-CM | POA: Diagnosis not present

## 2018-03-07 HISTORY — DX: Dependence on wheelchair: Z99.3

## 2018-03-07 HISTORY — DX: Unspecified osteoarthritis, unspecified site: M19.90

## 2018-03-07 HISTORY — DX: Atherosclerotic heart disease of native coronary artery without angina pectoris: I25.10

## 2018-03-07 HISTORY — DX: Type 2 diabetes mellitus without complications: E11.9

## 2018-03-07 HISTORY — DX: Complete loss of teeth, unspecified cause, unspecified class: Z97.2

## 2018-03-07 HISTORY — DX: Pain in unspecified hip: M25.559

## 2018-03-07 HISTORY — DX: Other chronic pain: G89.29

## 2018-03-07 HISTORY — DX: Presence of spectacles and contact lenses: Z97.3

## 2018-03-07 HISTORY — DX: Complete loss of teeth, unspecified cause, unspecified class: K08.109

## 2018-03-07 LAB — COMPREHENSIVE METABOLIC PANEL
ALBUMIN: 4.2 g/dL (ref 3.5–5.0)
ALT: 54 U/L — AB (ref 0–44)
AST: 41 U/L (ref 15–41)
Alkaline Phosphatase: 53 U/L (ref 38–126)
Anion gap: 12 (ref 5–15)
BILIRUBIN TOTAL: 0.8 mg/dL (ref 0.3–1.2)
BUN: 26 mg/dL — AB (ref 6–20)
CHLORIDE: 102 mmol/L (ref 98–111)
CO2: 24 mmol/L (ref 22–32)
CREATININE: 0.87 mg/dL (ref 0.61–1.24)
Calcium: 9.8 mg/dL (ref 8.9–10.3)
GFR calc Af Amer: >60 mL/min (ref >60)
GFR calc non Af Amer: >60 mL/min (ref >60)
GLUCOSE: 123 mg/dL — AB (ref 70–99)
POTASSIUM: 4 mmol/L (ref 3.5–5.1)
Sodium: 138 mmol/L (ref 135–145)
Total Protein: 7.4 g/dL (ref 6.5–8.1)

## 2018-03-07 LAB — CBC WITH DIFFERENTIAL/PLATELET
ABS IMMATURE GRANULOCYTES: 0.03 10*3/uL (ref 0.00–0.07)
BASOS PCT: 1 %
Basophils Absolute: 0 10*3/uL (ref 0.0–0.1)
Eosinophils Absolute: 0.1 10*3/uL (ref 0.0–0.5)
Eosinophils Relative: 2 %
HEMATOCRIT: 48 % (ref 39.0–52.0)
HEMOGLOBIN: 15.3 g/dL (ref 13.0–17.0)
Immature Granulocytes: 0 %
LYMPHS ABS: 1.7 10*3/uL (ref 0.7–4.0)
LYMPHS PCT: 24 %
MCH: 29.4 pg (ref 26.0–34.0)
MCHC: 31.9 g/dL (ref 30.0–36.0)
MCV: 92.1 fL (ref 80.0–100.0)
MONO ABS: 0.8 10*3/uL (ref 0.1–1.0)
MONOS PCT: 11 %
NEUTROS ABS: 4.4 10*3/uL (ref 1.7–7.7)
Neutrophils Relative %: 62 %
Platelets: 201 10*3/uL (ref 150–400)
RBC: 5.21 MIL/uL (ref 4.22–5.81)
RDW: 12.5 % (ref 11.5–15.5)
WBC: 7.1 10*3/uL (ref 4.0–10.5)
nRBC: 0 % (ref 0.0–0.2)

## 2018-03-07 LAB — GLUCOSE, CAPILLARY: GLUCOSE-CAPILLARY: 128 mg/dL — AB (ref 70–99)

## 2018-03-07 LAB — HEMOGLOBIN A1C
HEMOGLOBIN A1C: 5.7 % — AB (ref 4.8–5.6)
Mean Plasma Glucose: 116.89 mg/dL

## 2018-03-07 LAB — ABO/RH: ABO/RH(D): B POS

## 2018-03-07 MED ORDER — CHLORHEXIDINE GLUCONATE 4 % EX LIQD
60.0000 mL | Freq: Once | CUTANEOUS | Status: DC
  Filled 2018-03-07: qty 60

## 2018-03-07 NOTE — Progress Notes (Addendum)
EKG dated 10-25-2017 in epic.  Cardiac clearance/ pre-op visit, dr end, dated 12-18-2017, in epic. Nuclear stress test dated 11-22-2017 in epic. Coronary CTA dated 01-15-2018 in epic, non-obstructive disease distal LAD and RCA , recommendation medical management.  Per pt was told by dr Andrey Campanile to not stop any of his current medication including asa.  Pt verbalized understanding since dr Andrey Campanile pre-op stated for him to discontinue, advised to call dr Andrey Campanile office and speak with his nurse to verify what medication to stop or continue.  ADDENDUM:  CMP results dated 03-07-2018 routed to dr Gaynelle Adu in epic.

## 2018-03-11 DIAGNOSIS — M160 Bilateral primary osteoarthritis of hip: Secondary | ICD-10-CM | POA: Diagnosis not present

## 2018-03-11 DIAGNOSIS — M25519 Pain in unspecified shoulder: Secondary | ICD-10-CM | POA: Diagnosis not present

## 2018-03-16 MED ORDER — BUPIVACAINE LIPOSOME 1.3 % IJ SUSP
20.0000 mL | Freq: Once | INTRAMUSCULAR | Status: DC
  Filled 2018-03-16: qty 20

## 2018-03-17 ENCOUNTER — Inpatient Hospital Stay (HOSPITAL_COMMUNITY): Payer: Medicare HMO | Admitting: Anesthesiology

## 2018-03-17 ENCOUNTER — Encounter (HOSPITAL_COMMUNITY)
Admission: RE | Disposition: A | Discharge status: Home Health | Payer: Self-pay | Source: Home / Self Care | Attending: General Surgery

## 2018-03-17 ENCOUNTER — Other Ambulatory Visit: Payer: Self-pay

## 2018-03-17 ENCOUNTER — Encounter (HOSPITAL_COMMUNITY): Payer: Self-pay

## 2018-03-17 ENCOUNTER — Inpatient Hospital Stay (HOSPITAL_COMMUNITY)
Admission: RE | Admit: 2018-03-17 | Discharge: 2018-03-18 | DRG: 621 | Disposition: A | Discharge status: Home Health | Payer: Medicare HMO | Attending: General Surgery | Admitting: General Surgery

## 2018-03-17 DIAGNOSIS — E7800 Pure hypercholesterolemia, unspecified: Secondary | ICD-10-CM | POA: Diagnosis not present

## 2018-03-17 DIAGNOSIS — E119 Type 2 diabetes mellitus without complications: Secondary | ICD-10-CM | POA: Diagnosis not present

## 2018-03-17 DIAGNOSIS — E538 Deficiency of other specified B group vitamins: Secondary | ICD-10-CM | POA: Diagnosis not present

## 2018-03-17 DIAGNOSIS — I25119 Atherosclerotic heart disease of native coronary artery with unspecified angina pectoris: Secondary | ICD-10-CM | POA: Diagnosis present

## 2018-03-17 DIAGNOSIS — Z6841 Body Mass Index (BMI) 40.0 and over, adult: Secondary | ICD-10-CM

## 2018-03-17 DIAGNOSIS — I2510 Atherosclerotic heart disease of native coronary artery without angina pectoris: Secondary | ICD-10-CM | POA: Diagnosis present

## 2018-03-17 DIAGNOSIS — Z8249 Family history of ischemic heart disease and other diseases of the circulatory system: Secondary | ICD-10-CM

## 2018-03-17 DIAGNOSIS — Z833 Family history of diabetes mellitus: Secondary | ICD-10-CM

## 2018-03-17 DIAGNOSIS — M160 Bilateral primary osteoarthritis of hip: Secondary | ICD-10-CM | POA: Diagnosis not present

## 2018-03-17 DIAGNOSIS — E669 Obesity, unspecified: Secondary | ICD-10-CM

## 2018-03-17 DIAGNOSIS — K660 Peritoneal adhesions (postprocedural) (postinfection): Secondary | ICD-10-CM | POA: Diagnosis not present

## 2018-03-17 DIAGNOSIS — Z9049 Acquired absence of other specified parts of digestive tract: Secondary | ICD-10-CM

## 2018-03-17 DIAGNOSIS — N400 Enlarged prostate without lower urinary tract symptoms: Secondary | ICD-10-CM | POA: Diagnosis present

## 2018-03-17 DIAGNOSIS — I10 Essential (primary) hypertension: Secondary | ICD-10-CM | POA: Diagnosis not present

## 2018-03-17 DIAGNOSIS — Z9884 Bariatric surgery status: Secondary | ICD-10-CM

## 2018-03-17 DIAGNOSIS — E1169 Type 2 diabetes mellitus with other specified complication: Secondary | ICD-10-CM | POA: Diagnosis present

## 2018-03-17 DIAGNOSIS — E559 Vitamin D deficiency, unspecified: Secondary | ICD-10-CM | POA: Diagnosis not present

## 2018-03-17 HISTORY — PX: LAPAROSCOPIC GASTRIC SLEEVE RESECTION: SHX5895

## 2018-03-17 LAB — GLUCOSE, CAPILLARY
Glucose-Capillary: 133 mg/dL — ABNORMAL HIGH (ref 70–99)
Glucose-Capillary: 143 mg/dL — ABNORMAL HIGH (ref 70–99)
Glucose-Capillary: 163 mg/dL — ABNORMAL HIGH (ref 70–99)
Glucose-Capillary: 234 mg/dL — ABNORMAL HIGH (ref 70–99)

## 2018-03-17 LAB — TYPE AND SCREEN
ABO/RH(D): B POS
Antibody Screen: NEGATIVE

## 2018-03-17 LAB — HEMOGLOBIN AND HEMATOCRIT, BLOOD
HCT: 46.3 % (ref 39.0–52.0)
Hemoglobin: 14.5 g/dL (ref 13.0–17.0)

## 2018-03-17 SURGERY — GASTRECTOMY, SLEEVE, LAPAROSCOPIC
Anesthesia: General | Site: Abdomen

## 2018-03-17 MED ORDER — PROPOFOL 10 MG/ML IV BOLUS
INTRAVENOUS | Status: DC | PRN
  Administered 2018-03-17: 200 mg via INTRAVENOUS

## 2018-03-17 MED ORDER — KETAMINE HCL 10 MG/ML IJ SOLN
INTRAMUSCULAR | Status: DC | PRN
  Administered 2018-03-17 (×2): 20 mg via INTRAVENOUS

## 2018-03-17 MED ORDER — ONDANSETRON HCL 4 MG/2ML IJ SOLN: 4.0000 mg | Freq: Once | INTRAMUSCULAR | Status: DC | PRN

## 2018-03-17 MED ORDER — METHOCARBAMOL 1000 MG/10ML IJ SOLN
1000.0000 mg | Freq: Three times a day (TID) | INTRAVENOUS | Status: DC | PRN
  Filled 2018-03-17: qty 10

## 2018-03-17 MED ORDER — DEXAMETHASONE SODIUM PHOSPHATE 10 MG/ML IJ SOLN
INTRAMUSCULAR | Status: DC | PRN
  Administered 2018-03-17: 10 mg via INTRAVENOUS

## 2018-03-17 MED ORDER — DEXAMETHASONE SODIUM PHOSPHATE 4 MG/ML IJ SOLN: 4.0000 mg | INTRAMUSCULAR | Status: DC

## 2018-03-17 MED ORDER — SUGAMMADEX SODIUM 500 MG/5ML IV SOLN
INTRAVENOUS | Status: AC
  Filled 2018-03-17: qty 5

## 2018-03-17 MED ORDER — LOSARTAN POTASSIUM 50 MG PO TABS
100.0000 mg | ORAL_TABLET | Freq: Every morning | ORAL | Status: DC
  Administered 2018-03-18: 100 mg via ORAL
  Filled 2018-03-17: qty 2

## 2018-03-17 MED ORDER — ACETAMINOPHEN 160 MG/5ML PO SOLN
650.0000 mg | Freq: Four times a day (QID) | ORAL | Status: DC
  Administered 2018-03-18 (×2): 650 mg via ORAL
  Filled 2018-03-17 (×2): qty 20.3

## 2018-03-17 MED ORDER — PROMETHAZINE HCL 25 MG/ML IJ SOLN: 12.5000 mg | Freq: Four times a day (QID) | INTRAMUSCULAR | Status: DC | PRN

## 2018-03-17 MED ORDER — PHENYLEPHRINE 40 MCG/ML (10ML) SYRINGE FOR IV PUSH (FOR BLOOD PRESSURE SUPPORT)
PREFILLED_SYRINGE | INTRAVENOUS | Status: DC | PRN
  Administered 2018-03-17: 40 ug via INTRAVENOUS
  Administered 2018-03-17 (×2): 80 ug via INTRAVENOUS
  Administered 2018-03-17: 40 ug via INTRAVENOUS
  Administered 2018-03-17 (×2): 80 ug via INTRAVENOUS
  Administered 2018-03-17: 40 ug via INTRAVENOUS

## 2018-03-17 MED ORDER — SUCCINYLCHOLINE CHLORIDE 200 MG/10ML IV SOSY
PREFILLED_SYRINGE | INTRAVENOUS | Status: DC | PRN
  Administered 2018-03-17: 160 mg via INTRAVENOUS

## 2018-03-17 MED ORDER — PHENYLEPHRINE 40 MCG/ML (10ML) SYRINGE FOR IV PUSH (FOR BLOOD PRESSURE SUPPORT)
PREFILLED_SYRINGE | INTRAVENOUS | Status: AC
  Filled 2018-03-17: qty 10

## 2018-03-17 MED ORDER — SODIUM CHLORIDE 0.9 % IV SOLN
2.0000 g | INTRAVENOUS | Status: AC
  Administered 2018-03-17: 2 g via INTRAVENOUS
  Filled 2018-03-17: qty 2

## 2018-03-17 MED ORDER — PROPOFOL 10 MG/ML IV BOLUS
INTRAVENOUS | Status: AC
  Filled 2018-03-17: qty 40

## 2018-03-17 MED ORDER — FENTANYL CITRATE (PF) 100 MCG/2ML IJ SOLN
INTRAMUSCULAR | Status: DC | PRN
  Administered 2018-03-17: 100 ug via INTRAVENOUS

## 2018-03-17 MED ORDER — LIDOCAINE 2% (20 MG/ML) 5 ML SYRINGE
INTRAMUSCULAR | Status: AC
  Filled 2018-03-17: qty 10

## 2018-03-17 MED ORDER — GABAPENTIN 100 MG PO CAPS: 200.0000 mg | ORAL_CAPSULE | Freq: Two times a day (BID) | ORAL | Status: DC

## 2018-03-17 MED ORDER — LACTATED RINGERS IV SOLN
INTRAVENOUS | Status: DC
  Administered 2018-03-17: 09:00:00 via INTRAVENOUS
  Administered 2018-03-17: 1000 mL via INTRAVENOUS

## 2018-03-17 MED ORDER — BUPIVACAINE LIPOSOME 1.3 % IJ SUSP
INTRAMUSCULAR | Status: DC | PRN
  Administered 2018-03-17: 20 mL

## 2018-03-17 MED ORDER — SIMETHICONE 80 MG PO CHEW: 80.0000 mg | CHEWABLE_TABLET | Freq: Four times a day (QID) | ORAL | Status: DC | PRN

## 2018-03-17 MED ORDER — MORPHINE SULFATE (PF) 2 MG/ML IV SOLN
1.0000 mg | INTRAVENOUS | Status: DC | PRN
  Administered 2018-03-17: 2 mg via INTRAVENOUS
  Filled 2018-03-17: qty 1

## 2018-03-17 MED ORDER — DEXAMETHASONE SODIUM PHOSPHATE 10 MG/ML IJ SOLN
INTRAMUSCULAR | Status: AC
  Filled 2018-03-17: qty 1

## 2018-03-17 MED ORDER — SCOPOLAMINE 1 MG/3DAYS TD PT72
1.0000 | MEDICATED_PATCH | TRANSDERMAL | Status: DC
  Administered 2018-03-17: 1.5 mg via TRANSDERMAL
  Filled 2018-03-17: qty 1

## 2018-03-17 MED ORDER — FENTANYL CITRATE (PF) 100 MCG/2ML IJ SOLN
INTRAMUSCULAR | Status: AC
  Filled 2018-03-17: qty 2

## 2018-03-17 MED ORDER — ACETAMINOPHEN 160 MG/5ML PO SOLN: 650.0000 mg | Freq: Four times a day (QID) | ORAL | Status: DC

## 2018-03-17 MED ORDER — SUGAMMADEX SODIUM 200 MG/2ML IV SOLN
INTRAVENOUS | Status: AC
  Filled 2018-03-17: qty 2

## 2018-03-17 MED ORDER — 0.9 % SODIUM CHLORIDE (POUR BTL) OPTIME
TOPICAL | Status: DC | PRN
  Administered 2018-03-17: 1000 mL

## 2018-03-17 MED ORDER — LACTATED RINGERS IR SOLN
Status: DC | PRN
  Administered 2018-03-17: 1000 mL

## 2018-03-17 MED ORDER — SUGAMMADEX SODIUM 500 MG/5ML IV SOLN
INTRAVENOUS | Status: DC | PRN
  Administered 2018-03-17: 300 mg via INTRAVENOUS

## 2018-03-17 MED ORDER — LIDOCAINE 2% (20 MG/ML) 5 ML SYRINGE
INTRAMUSCULAR | Status: DC | PRN
  Administered 2018-03-17: 1.5 mg/kg/h via INTRAVENOUS

## 2018-03-17 MED ORDER — DIPHENHYDRAMINE HCL 50 MG/ML IJ SOLN: 12.5000 mg | Freq: Three times a day (TID) | INTRAMUSCULAR | Status: DC | PRN

## 2018-03-17 MED ORDER — ONDANSETRON HCL 4 MG/2ML IJ SOLN
4.0000 mg | Freq: Four times a day (QID) | INTRAMUSCULAR | Status: DC | PRN
  Administered 2018-03-17: 4 mg via INTRAVENOUS
  Filled 2018-03-17: qty 2

## 2018-03-17 MED ORDER — ONDANSETRON HCL 4 MG/2ML IJ SOLN
INTRAMUSCULAR | Status: AC
  Filled 2018-03-17: qty 2

## 2018-03-17 MED ORDER — ACETAMINOPHEN 500 MG PO TABS
1000.0000 mg | ORAL_TABLET | ORAL | Status: AC
  Administered 2018-03-17: 1000 mg via ORAL
  Filled 2018-03-17: qty 2

## 2018-03-17 MED ORDER — PANTOPRAZOLE SODIUM 40 MG IV SOLR
40.0000 mg | Freq: Every day | INTRAVENOUS | Status: DC
  Administered 2018-03-17: 40 mg via INTRAVENOUS
  Filled 2018-03-17: qty 40

## 2018-03-17 MED ORDER — POTASSIUM CHLORIDE IN NACL 20-0.45 MEQ/L-% IV SOLN
INTRAVENOUS | Status: DC
  Administered 2018-03-17: 18:00:00 via INTRAVENOUS
  Filled 2018-03-17 (×2): qty 1000

## 2018-03-17 MED ORDER — PHENYLEPHRINE 40 MCG/ML (10ML) SYRINGE FOR IV PUSH (FOR BLOOD PRESSURE SUPPORT)
PREFILLED_SYRINGE | INTRAVENOUS | Status: AC
  Filled 2018-03-17: qty 20

## 2018-03-17 MED ORDER — PREMIER PROTEIN SHAKE
2.0000 [oz_av] | ORAL | Status: DC
  Administered 2018-03-18 (×4): 2 [oz_av] via ORAL

## 2018-03-17 MED ORDER — GABAPENTIN 300 MG PO CAPS
300.0000 mg | ORAL_CAPSULE | ORAL | Status: AC
  Administered 2018-03-17: 300 mg via ORAL
  Filled 2018-03-17: qty 1

## 2018-03-17 MED ORDER — LIDOCAINE 2% (20 MG/ML) 5 ML SYRINGE
INTRAMUSCULAR | Status: DC | PRN
  Administered 2018-03-17: 100 mg via INTRAVENOUS

## 2018-03-17 MED ORDER — ENALAPRILAT 1.25 MG/ML IV SOLN
1.2500 mg | Freq: Four times a day (QID) | INTRAVENOUS | Status: DC | PRN
  Filled 2018-03-17: qty 1

## 2018-03-17 MED ORDER — SODIUM CHLORIDE (PF) 0.9 % IJ SOLN
INTRAMUSCULAR | Status: AC
  Filled 2018-03-17: qty 50

## 2018-03-17 MED ORDER — INSULIN ASPART 100 UNIT/ML ~~LOC~~ SOLN
0.0000 [IU] | SUBCUTANEOUS | Status: DC
  Administered 2018-03-17 – 2018-03-18 (×2): 2 [IU] via SUBCUTANEOUS
  Administered 2018-03-18 (×2): 3 [IU] via SUBCUTANEOUS

## 2018-03-17 MED ORDER — OXYCODONE HCL 5 MG/5ML PO SOLN
5.0000 mg | ORAL | Status: DC | PRN
  Administered 2018-03-17: 5 mg via ORAL
  Filled 2018-03-17: qty 5

## 2018-03-17 MED ORDER — APREPITANT 40 MG PO CAPS
40.0000 mg | ORAL_CAPSULE | ORAL | Status: AC
  Administered 2018-03-17: 40 mg via ORAL
  Filled 2018-03-17: qty 1

## 2018-03-17 MED ORDER — LIDOCAINE 2% (20 MG/ML) 5 ML SYRINGE
INTRAMUSCULAR | Status: AC
  Filled 2018-03-17: qty 15

## 2018-03-17 MED ORDER — MIDAZOLAM HCL 2 MG/2ML IJ SOLN
INTRAMUSCULAR | Status: DC | PRN
  Administered 2018-03-17 (×2): 1 mg via INTRAVENOUS

## 2018-03-17 MED ORDER — ENOXAPARIN SODIUM 30 MG/0.3ML ~~LOC~~ SOLN
30.0000 mg | Freq: Two times a day (BID) | SUBCUTANEOUS | Status: DC
  Administered 2018-03-17 – 2018-03-18 (×2): 30 mg via SUBCUTANEOUS
  Filled 2018-03-17 (×2): qty 0.3

## 2018-03-17 MED ORDER — HEPARIN SODIUM (PORCINE) 5000 UNIT/ML IJ SOLN
5000.0000 [IU] | INTRAMUSCULAR | Status: AC
  Administered 2018-03-17: 5000 [IU] via SUBCUTANEOUS
  Filled 2018-03-17: qty 1

## 2018-03-17 MED ORDER — GABAPENTIN 100 MG PO CAPS
200.0000 mg | ORAL_CAPSULE | Freq: Two times a day (BID) | ORAL | Status: DC
  Administered 2018-03-18: 200 mg via ORAL
  Filled 2018-03-17 (×2): qty 2

## 2018-03-17 MED ORDER — SODIUM CHLORIDE (PF) 0.9 % IJ SOLN
INTRAMUSCULAR | Status: DC | PRN
  Administered 2018-03-17: 50 mL via INTRAVENOUS

## 2018-03-17 MED ORDER — MIDAZOLAM HCL 2 MG/2ML IJ SOLN
INTRAMUSCULAR | Status: AC
  Filled 2018-03-17: qty 2

## 2018-03-17 MED ORDER — ROCURONIUM BROMIDE 100 MG/10ML IV SOLN
INTRAVENOUS | Status: AC
  Filled 2018-03-17: qty 1

## 2018-03-17 MED ORDER — ROCURONIUM BROMIDE 100 MG/10ML IV SOLN
INTRAVENOUS | Status: DC | PRN
  Administered 2018-03-17: 10 mg via INTRAVENOUS
  Administered 2018-03-17: 60 mg via INTRAVENOUS

## 2018-03-17 MED ORDER — ONDANSETRON HCL 4 MG/2ML IJ SOLN
INTRAMUSCULAR | Status: DC | PRN
  Administered 2018-03-17: 4 mg via INTRAVENOUS

## 2018-03-17 MED ORDER — FENTANYL CITRATE (PF) 100 MCG/2ML IJ SOLN
25.0000 ug | INTRAMUSCULAR | Status: DC | PRN
  Administered 2018-03-17: 50 ug via INTRAVENOUS

## 2018-03-17 SURGICAL SUPPLY — 66 items
APPLICATOR COTTON TIP 6 STRL (MISCELLANEOUS) IMPLANT
APPLICATOR COTTON TIP 6IN STRL (MISCELLANEOUS)
APPLIER CLIP ROT 10 11.4 M/L (STAPLE)
APPLIER CLIP ROT 13.4 12 LRG (CLIP)
BANDAGE ADH SHEER 1  50/CT (GAUZE/BANDAGES/DRESSINGS) ×12 IMPLANT
BENZOIN TINCTURE PRP APPL 2/3 (GAUZE/BANDAGES/DRESSINGS) ×2 IMPLANT
BLADE SURG SZ11 CARB STEEL (BLADE) ×2 IMPLANT
CABLE HIGH FREQUENCY MONO STRZ (ELECTRODE) ×2 IMPLANT
CHLORAPREP W/TINT 26ML (MISCELLANEOUS) ×4 IMPLANT
CLIP APPLIE ROT 10 11.4 M/L (STAPLE) IMPLANT
CLIP APPLIE ROT 13.4 12 LRG (CLIP) IMPLANT
COVER SURGICAL LIGHT HANDLE (MISCELLANEOUS) ×2 IMPLANT
COVER WAND RF STERILE (DRAPES) ×2 IMPLANT
DECANTER SPIKE VIAL GLASS SM (MISCELLANEOUS) ×2 IMPLANT
DEVICE SUT QUICK LOAD TK 5 (STAPLE) IMPLANT
DEVICE SUT TI-KNOT TK 5X26 (MISCELLANEOUS) IMPLANT
DEVICE SUTURE ENDOST 10MM (ENDOMECHANICALS) IMPLANT
DISSECTOR BLUNT TIP ENDO 5MM (MISCELLANEOUS) IMPLANT
DRAPE UTILITY XL STRL (DRAPES) ×4 IMPLANT
ELECT L-HOOK LAP 45CM DISP (ELECTROSURGICAL)
ELECT PENCIL ROCKER SW 15FT (MISCELLANEOUS) ×2 IMPLANT
ELECT REM PT RETURN 15FT ADLT (MISCELLANEOUS) ×2 IMPLANT
ELECTRODE L-HOOK LAP 45CM DISP (ELECTROSURGICAL) IMPLANT
GAUZE SPONGE 2X2 8PLY STRL LF (GAUZE/BANDAGES/DRESSINGS) IMPLANT
GAUZE SPONGE 4X4 12PLY STRL (GAUZE/BANDAGES/DRESSINGS) IMPLANT
GLOVE BIO SURGEON STRL SZ7.5 (GLOVE) ×2 IMPLANT
GLOVE INDICATOR 8.0 STRL GRN (GLOVE) ×2 IMPLANT
GOWN STRL REUS W/TWL XL LVL3 (GOWN DISPOSABLE) ×6 IMPLANT
GRASPER SUT TROCAR 14GX15 (MISCELLANEOUS) ×2 IMPLANT
HOVERMATT SINGLE USE (MISCELLANEOUS) ×2 IMPLANT
KIT BASIN OR (CUSTOM PROCEDURE TRAY) ×2 IMPLANT
MARKER SKIN DUAL TIP RULER LAB (MISCELLANEOUS) ×2 IMPLANT
NEEDLE SPNL 22GX3.5 QUINCKE BK (NEEDLE) ×2 IMPLANT
PACK UNIVERSAL I (CUSTOM PROCEDURE TRAY) ×2 IMPLANT
RELOAD STAPLER 60MM BLK (STAPLE) IMPLANT
RELOAD STAPLER BLUE 60MM (STAPLE) ×3 IMPLANT
RELOAD STAPLER GOLD 60MM (STAPLE) ×1 IMPLANT
RELOAD STAPLER GREEN 60MM (STAPLE) ×2 IMPLANT
SCISSORS LAP 5X45 EPIX DISP (ENDOMECHANICALS) IMPLANT
SEALANT SURGICAL APPL DUAL CAN (MISCELLANEOUS) IMPLANT
SET IRRIG TUBING LAPAROSCOPIC (IRRIGATION / IRRIGATOR) ×2 IMPLANT
SHEARS HARMONIC ACE PLUS 45CM (MISCELLANEOUS) ×2 IMPLANT
SLEEVE GASTRECTOMY 40FR VISIGI (MISCELLANEOUS) ×2 IMPLANT
SLEEVE XCEL OPT CAN 5 100 (ENDOMECHANICALS) ×6 IMPLANT
SOLUTION ANTI FOG 6CC (MISCELLANEOUS) ×2 IMPLANT
SPONGE GAUZE 2X2 STER 10/PKG (GAUZE/BANDAGES/DRESSINGS)
SPONGE LAP 18X18 RF (DISPOSABLE) ×2 IMPLANT
STAPLER ECHELON BIOABSB 60 FLE (MISCELLANEOUS) ×10 IMPLANT
STAPLER ECHELON LONG 60 440 (INSTRUMENTS) ×2 IMPLANT
STAPLER RELOAD 60MM BLK (STAPLE)
STAPLER RELOAD BLUE 60MM (STAPLE) ×6
STAPLER RELOAD GOLD 60MM (STAPLE) ×2
STAPLER RELOAD GREEN 60MM (STAPLE) ×4
STRIP CLOSURE SKIN 1/2X4 (GAUZE/BANDAGES/DRESSINGS) ×2 IMPLANT
SUT MNCRL AB 4-0 PS2 18 (SUTURE) ×2 IMPLANT
SUT SURGIDAC NAB ES-9 0 48 120 (SUTURE) IMPLANT
SUT VICRYL 0 TIES 12 18 (SUTURE) ×2 IMPLANT
SYR 20CC LL (SYRINGE) ×2 IMPLANT
SYR 50ML LL SCALE MARK (SYRINGE) ×2 IMPLANT
TOWEL OR 17X26 10 PK STRL BLUE (TOWEL DISPOSABLE) ×2 IMPLANT
TOWEL OR NON WOVEN STRL DISP B (DISPOSABLE) ×2 IMPLANT
TROCAR BLADELESS 15MM (ENDOMECHANICALS) ×2 IMPLANT
TROCAR BLADELESS OPT 5 100 (ENDOMECHANICALS) ×2 IMPLANT
TUBING CONNECTING 10 (TUBING) ×4 IMPLANT
TUBING ENDO SMARTCAP (MISCELLANEOUS) ×2 IMPLANT
TUBING INSUF HEATED (TUBING) ×2 IMPLANT

## 2018-03-17 NOTE — Op Note (Signed)
03/17/2018 Steven Allen [birth year] [id number]   PRE-OPERATIVE DIAGNOSIS:     Severe obesity (BMI 47)   Hypertension   Diabetes mellitus type 2 in obese (HCC)   Hypercholesterolemia without hypertriglyceridemia   Bilateral primary osteoarthritis of hip   Non-occlusive coronary artery disease  POST-OPERATIVE DIAGNOSIS:  same  PROCEDURE:  Procedure(s): LAPAROSCOPIC SLEEVE GASTRECTOMY  UPPER GI ENDOSCOPY  SURGEON:  Surgeon(s): Atilano Ina, MD FACS FASMBS  ASSISTANTS: Glenna Fellows MD FACS  ANESTHESIA:   general  DRAINS: none   BOUGIE: 40 fr ViSiGi  LOCAL MEDICATIONS USED:   Exparel  EBL: <25 cc  SPECIMEN:  Source of Specimen:  Greater curvature of stomach  DISPOSITION OF SPECIMEN:  PATHOLOGY  COUNTS:  YES  INDICATION FOR PROCEDURE: This is a very pleasant 57 y.o.-year-old morbidly obese male who has had unsuccessful attempts for sustained weight loss. The patient presents today for a planned laparoscopic sleeve gastrectomy with upper endoscopy. We have discussed the risk and benefits of the procedure extensively preoperatively. Please see my separate notes.  PROCEDURE: After obtaining informed consent and receiving 5000 units of subcutaneous heparin, the patient was brought to the operating room at [facility] and placed supine on the operating room table. General endotracheal anesthesia was established. Sequential compression devices were placed. A orogastric tube was placed. The patient's abdomen was prepped and draped in the usual standard surgical fashion. The patient received preoperative IV antibiotic. A surgical timeout was performed. ERAS protocol used.   Access to the abdomen was achieved using a 5 mm 0 laparoscope thru a 5 mm trocar In the left upper Quadrant 2 fingerbreadths below the left subcostal margin using the Optiview technique. Pneumoperitoneum was smoothly established up to 15 mm of mercury.  The patient had had a previous laparoscopic  repair of incisional hernia with mesh at outside hospital.  They used Parietex mesh.  The laparoscope was advanced and the abdominal cavity was surveilled.  The mesh repair appeared intact.  There were some omental adhesions to a segment of the mesh but these were left alone.  The patient was then placed in reverse Trendelenburg.   A 5 mm trocar was placed slightly above and to the left of the umbilicus under direct visualization.  The [facility] liver retractor was placed under the left lobe of the liver through a 5 mm trocar incision site in the subxiphoid position. A 5 mm trocar was placed in the lateral right upper quadrant along with a 15 mm trocar in the mid right abdomen. A final 5 mm trocar was placed in the lateral LUQ.  All under direct visualization after exparel had been infiltrated in bilateral lateral upper abdominal walls as a TAP block.  The stomach was inspected. It was completely decompressed and the orogastric tube was removed.  There was no anterior dimple that was obviously visible.  His preoperative upper GI showed no evidence of a hiatal hernia.  We identified the pylorus and measured 6 cm proximal to the pylorus and identified an area of where we would start taking down the short gastric vessels. Harmonic scalpel was used to take down the short gastric vessels along the greater curvature of the stomach. We were able to enter the lesser sac. We continued to march along the greater curvature of the stomach taking down the short gastrics. As we approached the gastrosplenic ligament we took care in this area not to injure the spleen. We were able to take down the entire gastrosplenic ligament. We then mobilized  the fundus away from the left crus of diaphragm. There were not any significant posterior gastric avascular attachments. This left the stomach completely mobilized. No vessels had been taken down along the lesser curvature of the stomach.  We then reidentified the pylorus. A 40Fr  ViSiGi was then placed in the oropharynx and advanced down into the stomach and placed in the distal antrum and positioned along the lesser curvature. It was placed under suction which secured the 40Fr ViSiGi in place along the lesser curve. Then using the [employer] echelon 60 mm stapler with a green load with Seamguard, I placed a stapler along the antrum approximately 5 cm from the pylorus. The stapler was angled so that there is ample room at the angularis incisura. I then fired the first staple load after inspecting it posteriorly to ensure adequate space both anteriorly and posteriorly. At this point I still was not completely past the angularis so with another green load with Seamguard, I placed the stapler in position just inside the prior stapleline. We then rotated the stomach to insure that there was adequate anteriorly as well as posteriorly. The stapler was then fired.  At this point I started using 60 mm gold load staple cartridge x1 with Seamguard. The echelon stapler was then repositioned with a 60 mm blue load with Seamguard and we continued to march up along the ViSiGi. My assistant was holding traction along the greater curvature stomach along the cauterized short gastric vessels ensuring that the stomach was symmetrically retracted. Prior to each firing of the staple, we rotated the stomach to ensure that there is adequate stomach left.  As we approached the fundus, I used 60 mm blue cartridge with Seamguard aiming  lateral to the GE junction after mobilizing some of the esophageal fat pad.  The sleeve was inspected. There is no evidence of cork screw. The staple line appeared hemostatic. The CRNA inflated the ViSiGi to the green zone and the upper abdomen was flooded with saline. There were no bubbles. The sleeve was decompressed and the ViSiGi removed. My assistant scrubbed out and performed an upper endoscopy. The sleeve easily distended with air and the scope was easily advanced to the pylorus.  There is no evidence of internal bleeding or cork screwing. There was no narrowing at the angularis. There is no evidence of bubbles. Please see his operative note for further details. The gastric sleeve was decompressed and the endoscope was removed.  The greater curvature the stomach was grasped with a laparoscopic grasper and removed from the 15 mm trocar site.  The liver retractor was removed. I then closed the 15 mm trocar site with 1 interrupted 0 Vicryl sutures through the fascia using the endoclose. The closure was viewed laparoscopically and it was airtight. Remaining Exparel was then infiltrated in the preperitoneal spaces around the trocar sites. Pneumoperitoneum was released. All trocar sites were closed with a 4-0 Monocryl in a subcuticular fashion followed by the application of benzoin, steri-strips, and bandaids. The patient was extubated and taken to the recovery room in stable condition. All needle, instrument, and sponge counts were correct x2. There are no immediate complications  (2) 60 mm green with Seamguard (1) 60 mm gold with seamguard (3) 60 mm blue with 2 seamguard  PLAN OF CARE: Admit to inpatient   PATIENT DISPOSITION:  PACU - hemodynamically stable.   Delay start of Pharmacological VTE agent (>24hrs) due to surgical blood loss or risk of bleeding:  no  Steven SellaEric M. Andrey CampanileWilson, MD,  FACS FASMBS General, Bariatric, & Minimally Invasive Surgery [facility]

## 2018-03-17 NOTE — Progress Notes (Signed)
PHARMACY CONSULT FOR:  Risk Assessment for Post-Discharge VTE Following Bariatric Surgery  Post-Discharge VTE Risk Assessment: This patient's probability of 30-day post-discharge VTE is increased due to the factors marked:  x Male    Age >/=60 years    BMI >/=50 kg/m2    CHF    Dyspnea at Rest    Paraplegia  x Non-gastric-band surgery    Operation Time >/=3 hr    Return to OR     Length of Stay >/= 3 d   Predicted probability of 30-day post-discharge VTE: 0.31%  Other patient-specific factors to consider:  Recommendation for Discharge: No pharmacologic prophylaxis post-discharge  Steven Allen is a 57 y.o. male who underwent  Sleeve gastrectomy on 03/17/18   Case start: 12:37 Case end: 13:52  No Known Allergies  Patient Measurements: Height: 5\' 10"  (177.8 cm) Weight: (!) 326 lb (147.9 kg) IBW/kg (Calculated) : 73 Body mass index is 46.78 kg/m.  Recent Labs    03/17/18 1420  HGB 14.5  HCT 46.3   Estimated Creatinine Clearance: 133.2 mL/min (by C-G formula based on SCr of 0.87 mg/dL).  Past Medical History:  Diagnosis Date  . Body mass index (BMI) 45.0-49.9, adult (HCC)   . Chronic hip pain   . Coronary artery disease    cardiology -- dr end-- nuclear stress test 11-22-2017 showed abnormal with intermediate risk , recommended coronary CTA and if no obstruction pt cleared for surgery (per final result non-obstructve cad in distal LAD and RCA  . Decreased strength of upper extremity   . Exercise counseling   . Full dentures   . Hyperlipidemia   . Hypertension   . Morbid obesity due to excess calories (HCC)   . OA (osteoarthritis)    bilateral hips and knees  . Type 2 diabetes mellitus (HCC)    followed by pcp  . Uses wheelchair    due to bilateral hip pain  . Wears glasses    Medications Prior to Admission  Medication Sig Dispense Refill Last Dose  . aspirin 81 MG EC tablet Take 81 mg by mouth daily.    03/16/2018 at Unknown time  . atorvastatin  (LIPITOR) 20 MG tablet Take 20 mg by mouth at bedtime.    Past Week at Unknown time  . Cholecalciferol (VITAMIN D3) 5000 units CAPS Take 5,000 Units by mouth daily.   03/16/2018 at Unknown time  . cyanocobalamin 1000 MCG tablet Take 1,000 mcg by mouth daily.   03/16/2018 at Unknown time  . losartan (COZAAR) 100 MG tablet Take 100 mg by mouth every morning.    03/16/2018 at Unknown time  . metFORMIN (GLUCOPHAGE) 1000 MG tablet Take 1,000 mg by mouth 2 (two) times daily with a meal.   03/16/2018 at Unknown time  . methocarbamol (ROBAXIN) 750 MG tablet Take 750 mg by mouth daily.    03/16/2018 at Unknown time  . Multiple Vitamin (MULTIVITAMIN) tablet Take 1 tablet by mouth daily.   03/16/2018 at Unknown time  . naproxen (NAPROSYN) 500 MG tablet Take 500 mg by mouth 2 (two) times daily with a meal.   03/16/2018 at Unknown time  . metoprolol tartrate (LOPRESSOR) 50 MG tablet Take 1 tablet (50 mg total) by mouth once for 1 dose. (Patient not taking: Reported on 03/03/2018) 1 tablet 0 Not Taking at Unknown time   Otho BellowsGreen, Brailee Riede L PharmD Pager [phone] 03/17/2018, 5:50 PM

## 2018-03-17 NOTE — Op Note (Signed)
Procedure: Upper GI endoscopy  Description of procedure: Upper GI endoscopy is performed at the completion of laparoscopic sleeve gastrectomy by Dr.  Tressa BusmanWlson.  The video endoscope was introduced into the upper esophagus and then passed to the EG junction at about 40 cm. The esophagus appeared normal. The gastric sleeve was entered. The sleeve was tensely distended with air while the outlet was obstructed under saline irrigation by the operating surgeon. There was no evidence of leak. The staple line was intact and without bleeding. The scope was advanced to the antrum and pylorus visualized. There was no stricture or twisting or mucosal abnormality, and mild angulation but no ;narrowing at the incisura.  The pouch was then desufflated and the scope withdrawn.  Steven SaaBenjamin T Laprecious Austill MD, FACS  03/17/2018, 1:34 PM

## 2018-03-17 NOTE — Anesthesia Postprocedure Evaluation (Signed)
Anesthesia Post Note  Patient: Steven Allen  Procedure(s) Performed: LAPAROSCOPIC GASTRIC SLEEVE RESECTION, UPPER ENDO, ERAS PATHWAY (N/A Abdomen)     Patient location during evaluation: PACU Anesthesia Type: General Level of consciousness: awake and alert Pain management: pain level controlled Vital Signs Assessment: post-procedure vital signs reviewed and stable Respiratory status: spontaneous breathing, nonlabored ventilation, respiratory function stable and patient connected to nasal cannula oxygen Cardiovascular status: blood pressure returned to baseline and stable Postop Assessment: no apparent nausea or vomiting Anesthetic complications: no    Last Vitals:  Vitals:   03/17/18 1759 03/17/18 1918  BP: (!) 161/89 (!) 168/90  Pulse: 68 71  Resp: 19 17  Temp: 36.8 C 36.7 C  SpO2: 97% 97%    Last Pain:  Vitals:   03/17/18 1918  TempSrc: Oral  PainSc:                  [name]

## 2018-03-17 NOTE — Anesthesia Preprocedure Evaluation (Addendum)
Anesthesia Evaluation  Patient identified by MRN, date of birth, ID band Patient awake    Reviewed: Allergy & Precautions, NPO status , Patient's Chart, lab work & pertinent test results  Airway Mallampati: I  TM Distance: >3 FB Neck ROM: Full    Dental  (+) Poor Dentition, Missing, Edentulous Upper   Pulmonary neg pulmonary ROS,    Pulmonary exam normal breath sounds clear to auscultation       Cardiovascular hypertension, Pt. on medications + CAD  Normal cardiovascular exam Rhythm:Regular Rate:Normal  ECG: NSR, rate 83  Myocardial Nuclear stress EF: 53%. No T wave inversion was noted during stress. There was no ST segment deviation noted during stress. Defect 1: There is a large defect of moderate severity. Findings consistent with prior myocardial infarction. This is an intermediate risk study.  CT Borderline FFR CT in distal LAD and RCA suggests medical Rx appropriate Charlton HawsPeter Nishan  Case discussed with Dr. Okey DupreEnd   Neuro/Psych negative neurological ROS  negative psych ROS   GI/Hepatic negative GI ROS, Neg liver ROS,   Endo/Other  diabetes, Oral Hypoglycemic AgentsMorbid obesity  Renal/GU negative Renal ROS     Musculoskeletal Uses wheelchair   Abdominal (+) + obese,   Peds  Hematology HLD   Anesthesia Other Findings Morbid Obesity, DMII, HTN, Limited Mobility, Joint and Hip Pain  Reproductive/Obstetrics                            Anesthesia Physical Anesthesia Plan  ASA: III  Anesthesia Plan: General   Post-op Pain Management:    Induction: Intravenous  PONV Risk Score and Plan: 2 and Ondansetron, Dexamethasone, Midazolam and Treatment may vary due to age or medical condition  Airway Management Planned: Oral ETT  Additional Equipment:   Intra-op Plan:   Post-operative Plan: Extubation in OR  Informed Consent: I have reviewed the patients History and Physical, chart,  labs and discussed the procedure including the risks, benefits and alternatives for the proposed anesthesia with the patient or authorized representative who has indicated his/her understanding and acceptance.   Dental advisory given  Plan Discussed with: CRNA  Anesthesia Plan Comments: (Patient encouraged to shave beard Potential arterial line placement discussed with patient )       Anesthesia Quick Evaluation

## 2018-03-17 NOTE — Transfer of Care (Signed)
Immediate Anesthesia Transfer of Care Note  Patient: Steven Allen  Procedure(s) Performed: LAPAROSCOPIC GASTRIC SLEEVE RESECTION, UPPER ENDO, ERAS PATHWAY (N/A Abdomen)  Patient Location: PACU  Anesthesia Type:General  Level of Consciousness: awake and patient cooperative  Airway & Oxygen Therapy: Patient Spontanous Breathing and Patient connected to face mask oxygen  Post-op Assessment: Report given to RN and Post -op Vital signs reviewed and stable  Post vital signs: Reviewed and stable  Last Vitals:  Vitals Value Taken Time  BP 156/105 03/17/2018  2:00 PM  Temp    Pulse 69 03/17/2018  2:01 PM  Resp 20 03/17/2018  2:01 PM  SpO2 99 % 03/17/2018  2:01 PM  Vitals shown include unvalidated device data.  Last Pain:  Vitals:   03/17/18 0910  TempSrc: Oral         Complications: No apparent anesthesia complications

## 2018-03-17 NOTE — Anesthesia Procedure Notes (Signed)
Procedure Name: Intubation Date/Time: 03/17/2018 12:20 PM Performed by: West Pugh, CRNA Pre-anesthesia Checklist: Patient identified, Emergency Drugs available, Suction available, Patient being monitored and Timeout performed Patient Re-evaluated:Patient Re-evaluated prior to induction Oxygen Delivery Method: Circle system utilized Preoxygenation: Pre-oxygenation with 100% oxygen Induction Type: IV induction and Cricoid Pressure applied Ventilation: Mask ventilation without difficulty and Two handed mask ventilation required Laryngoscope Size: Mac and 4 Grade View: Grade I Tube type: Oral Tube size: 7.5 mm Number of attempts: 1 Airway Equipment and Method: Stylet Placement Confirmation: ETT inserted through vocal cords under direct vision,  positive ETCO2,  CO2 detector and breath sounds checked- equal and bilateral Secured at: 22 cm Tube secured with: Tape Dental Injury: Teeth and Oropharynx as per pre-operative assessment

## 2018-03-17 NOTE — Interval H&P Note (Signed)
History and Physical Interval Note:  03/17/2018 11:24 AM  Steven Allen  has presented today for surgery, with the diagnosis of Morbid Obesity, DMII, HTN, Limited Mobility, Joint and Hip Pain  The various methods of treatment have been discussed with the patient and family. After consideration of risks, benefits and other options for treatment, the patient has consented to  Procedure(s): LAPAROSCOPIC GASTRIC SLEEVE RESECTION, UPPER ENDO, ERAS PATHWAY (N/A) as a surgical intervention .  The patient's history has been reviewed, patient examined, no change in status, stable for surgery.  I have reviewed the patient's chart and labs.  Questions were answered to the patient's satisfaction.     Gaynelle AduEric Alianis Trimmer

## 2018-03-18 ENCOUNTER — Encounter (HOSPITAL_COMMUNITY): Payer: Self-pay | Admitting: General Surgery

## 2018-03-18 LAB — GLUCOSE, CAPILLARY
GLUCOSE-CAPILLARY: 153 mg/dL — AB (ref 70–99)
GLUCOSE-CAPILLARY: 120 mg/dL — AB (ref 70–99)
GLUCOSE-CAPILLARY: 124 mg/dL — AB (ref 70–99)
GLUCOSE-CAPILLARY: 117 mg/dL — AB (ref 70–99)

## 2018-03-18 LAB — CBC WITH DIFFERENTIAL/PLATELET
ABS IMMATURE GRANULOCYTES: 0.02 10*3/uL (ref 0.00–0.07)
BASOS ABS: 0 10*3/uL (ref 0.0–0.1)
BASOS PCT: 0 %
Eosinophils Absolute: 0.2 10*3/uL (ref 0.0–0.5)
Eosinophils Relative: 2 %
HCT: 44 % (ref 39.0–52.0)
Hemoglobin: 13.7 g/dL (ref 13.0–17.0)
Immature Granulocytes: 0 %
Lymphocytes Relative: 9 %
Lymphs Abs: 0.7 10*3/uL (ref 0.7–4.0)
MCH: 29.1 pg (ref 26.0–34.0)
MCHC: 31.1 g/dL (ref 30.0–36.0)
MCV: 93.6 fL (ref 80.0–100.0)
MONO ABS: 0.6 10*3/uL (ref 0.1–1.0)
Monocytes Relative: 7 %
NEUTROS ABS: 6.8 10*3/uL (ref 1.7–7.7)
NRBC: 0 % (ref 0.0–0.2)
Neutrophils Relative %: 82 %
PLATELETS: 175 10*3/uL (ref 150–400)
RBC: 4.7 MIL/uL (ref 4.22–5.81)
RDW: 12.4 % (ref 11.5–15.5)
WBC: 8.3 10*3/uL (ref 4.0–10.5)

## 2018-03-18 LAB — COMPREHENSIVE METABOLIC PANEL
ALBUMIN: 3.8 g/dL (ref 3.5–5.0)
ALK PHOS: 48 U/L (ref 38–126)
ALT: 61 U/L — AB (ref 0–44)
AST: 37 U/L (ref 15–41)
Anion gap: 11 (ref 5–15)
BILIRUBIN TOTAL: 1 mg/dL (ref 0.3–1.2)
BUN: 13 mg/dL (ref 6–20)
CALCIUM: 9 mg/dL (ref 8.9–10.3)
CO2: 23 mmol/L (ref 22–32)
CREATININE: 0.83 mg/dL (ref 0.61–1.24)
Chloride: 104 mmol/L (ref 98–111)
Glucose, Bld: 145 mg/dL — ABNORMAL HIGH (ref 70–99)
Potassium: 4.1 mmol/L (ref 3.5–5.1)
SODIUM: 138 mmol/L (ref 135–145)
TOTAL PROTEIN: 6.8 g/dL (ref 6.5–8.1)

## 2018-03-18 MED ORDER — ONDANSETRON 4 MG PO TBDP: 4.0000 mg | ORAL_TABLET | Freq: Four times a day (QID) | ORAL | 0 refills | Status: DC | PRN

## 2018-03-18 MED ORDER — ENOXAPARIN (LOVENOX) PATIENT EDUCATION KIT
PACK | Freq: Once | Status: AC
  Administered 2018-03-18: 13:00:00
  Filled 2018-03-18: qty 1

## 2018-03-18 MED ORDER — PANTOPRAZOLE SODIUM 40 MG PO TBEC: 40.0000 mg | DELAYED_RELEASE_TABLET | Freq: Every day | ORAL | 0 refills | Status: DC

## 2018-03-18 MED ORDER — TRAMADOL HCL 50 MG PO TABS: 50.0000 mg | ORAL_TABLET | Freq: Four times a day (QID) | ORAL | 0 refills | Status: DC | PRN

## 2018-03-18 MED ORDER — ENOXAPARIN SODIUM 40 MG/0.4ML ~~LOC~~ SOLN: 40.0000 mg | Freq: Two times a day (BID) | SUBCUTANEOUS | 1 refills | Status: DC

## 2018-03-18 MED ORDER — PROMETHAZINE HCL 25 MG/ML IJ SOLN: 12.5000 mg | Freq: Four times a day (QID) | INTRAMUSCULAR | Status: DC | PRN

## 2018-03-18 NOTE — Discharge Summary (Signed)
Physician Discharge Summary  Steven Allen WJX:[id number] DOB: 20-May-1960 DOA: 03/17/2018  PCP: [name], PA  Admit date: 03/17/2018 Discharge date: 03/18/2018  Recommendations for Outpatient Follow-up:   Follow-up Information    Greer Pickerel, MD. Go on 04/16/2018.   Specialty:  General Surgery Why:  at [address] information: [address] [place] [address] [phone]        Carlena Hurl, PA-C. Go on 05/06/2018.   Specialty:  General Surgery Why:  at 2 Contact information: [address] [place] [address] [phone]        [facility] Follow [facility].   Specialty:  Home Health Services Why:  physical therapy Contact information: [address] [place] [address] [phone]          Discharge Diagnoses:  Principal Problem:   Severe obesity (BMI >= 40) (HCC) Active Problems:   Hypertension   Diabetes mellitus type 2 in obese (HCC)   Hypercholesterolemia without hypertriglyceridemia   Bilateral primary osteoarthritis of hip   Non-occlusive coronary artery disease   Severe obesity ([place])   Surgical Procedure: Laparoscopic Sleeve Gastrectomy, upper endoscopy  Discharge Condition: Good Disposition: Home  Diet recommendation: Postoperative sleeve gastrectomy diet (liquids only)  Filed Weights   03/17/18 0910  Weight: (!) 147.9 kg     Hospital Course:  The patient was admitted for a planned laparoscopic sleeve gastrectomy. Please see operative note. Preoperatively the patient was given 5000 units of subcutaneous heparin for DVT prophylaxis. Postoperative prophylactic Lovenox dosing was started on the evening of postoperative day 0. ERAS protocol was used. On the evening of postoperative day 0, the patient was started on water and ice chips. On postoperative day 1 the patient had no fever or tachycardia and was tolerating water in their diet was gradually advanced throughout the day. The  patient was ambulating without difficulty. Their vital signs are stable without fever or tachycardia. Their hemoglobin had remained stable.  PT & OT evaluated the pt. PT recommended home health PT. Because of his mobility I recommended extended DVT prophylaxis with lovenox.  The patient had received discharge instructions and counseling. They were deemed stable for discharge and had met discharge criteria  BP (!) 170/80 (BP Location: Right Arm)   Pulse 65   Temp 97.8 F (36.6 C) (Oral)   Resp 16   Ht _0  (1.778 m)   Wt (!) 147.9 kg   SpO2 99%   BMI 46.78 kg/m   Gen: alert, NAD, non-toxic appearing Pupils: equal, no scleral icterus Pulm: Lungs clear to auscultation, symmetric chest rise; pulls 2000 on IS CV: regular rate and rhythm Abd: soft,min tender, nondistended. No cellulitis. No incisional hernia Ext: no edema, no calf tenderness Skin: no rash, no jaundice  Discharge Instructions  Discharge Instructions    Ambulate hourly while awake   Complete by:  As directed    Call MD for:  difficulty breathing, headache or visual disturbances   Complete by:  As directed    Call MD for:  persistant dizziness or light-headedness   Complete by:  As directed    Call MD for:  persistant nausea and vomiting   Complete by:  As directed    Call MD for:  redness, tenderness, or signs of infection (pain, swelling, redness, odor or green/yellow discharge around incision site)   Complete by:  As directed    Call MD for:  severe uncontrolled pain   Complete by:  As directed  Call MD for:  temperature >101 F   Complete by:  As directed    Diet bariatric full liquid   Complete by:  As directed    Discharge instructions   Complete by:  As directed    See bariatric discharge instructions   Incentive spirometry   Complete by:  As directed    Perform hourly while awake     Allergies as of 03/18/2018   No Known Allergies     Medication List    STOP taking these medications   aspirin  81 MG EC tablet   metFORMIN 1000 MG tablet Commonly known as:  GLUCOPHAGE   metoprolol tartrate 50 MG tablet Commonly known as:  LOPRESSOR   naproxen 500 MG tablet Commonly known as:  NAPROSYN     TAKE these medications   atorvastatin 20 MG tablet Commonly known as:  LIPITOR Take 20 mg by mouth at bedtime.   cyanocobalamin 1000 MCG tablet Take 1,000 mcg by mouth daily.   enoxaparin 40 MG/0.4ML injection Commonly known as:  LOVENOX Inject 0.4 mLs (40 mg total) into the skin every 12 (twelve) hours.   losartan 100 MG tablet Commonly known as:  COZAAR Take 100 mg by mouth every morning. Notes to patient:  Monitor Blood Pressure Daily and keep a log for primary care physician.  Monitor for symptoms of dehydration.  You may need to make changes to your medications with rapid weight loss.     methocarbamol 750 MG tablet Commonly known as:  ROBAXIN Take 750 mg by mouth daily.   multivitamin tablet Take 1 tablet by mouth daily.   ondansetron 4 MG disintegrating tablet Commonly known as:  ZOFRAN-ODT Take 1 tablet (4 mg total) by mouth every 6 (six) hours as needed for nausea or vomiting.   pantoprazole 40 MG tablet Commonly known as:  PROTONIX Take 1 tablet (40 mg total) by mouth daily.   traMADol 50 MG tablet Commonly known as:  ULTRAM Take 1 tablet (50 mg total) by mouth every 6 (six) hours as needed (pain).   Vitamin D3 125 MCG (5000 UT) Caps Take 5,000 Units by mouth daily.      Follow-up Information    Greer Pickerel, MD. Go on 04/16/2018.   Specialty:  General Surgery Why:  at [address] information: [address] [place] [address] [phone]        Carlena Hurl, PA-C. Go on 05/06/2018.   Specialty:  General Surgery Why:  at 2 Contact information: [address] [place] [address] [phone]        [facility], [facility] Follow [facility].   Specialty:  [place] Why:  physical therapy Contact  information: [address] [place] [address] [phone]            The results of significant diagnostics from this hospitalization (including imaging, microbiology, ancillary and laboratory) are listed below for reference.    Significant Diagnostic Studies: No results found.  Labs: Basic Metabolic Panel: Recent Labs  Lab 03/18/18 0347  NA 138  K 4.1  CL 104  CO2 23  GLUCOSE 145*  BUN 13  CREATININE 0.83  CALCIUM 9.0   Liver Function Tests: Recent Labs  Lab 03/18/18 0347  AST 37  ALT 61*  ALKPHOS 48  BILITOT 1.0  PROT 6.8  ALBUMIN 3.8    CBC: Recent Labs  Lab 03/17/18 1420 03/18/18 0347  WBC  --  8.3  NEUTROABS  --  6.8  HGB 14.5 13.7  HCT 46.3 44.0  MCV  --  93.6  PLT  --  175    CBG: Recent Labs  Lab 03/17/18 2352 03/18/18 0301 03/18/18 0754 03/18/18 1152 03/18/18 1557  GLUCAP 163* 153* 120* 124* 117*    Principal Problem:   Severe obesity (BMI >= 40) (HCC) Active Problems:   Hypertension   Diabetes mellitus type 2 in obese (HCC)   Hypercholesterolemia without hypertriglyceridemia   Bilateral primary osteoarthritis of hip   Non-occlusive coronary artery disease   Severe obesity ([place])   Time coordinating discharge: 15 min  Signed:  Gayland Curry, MD [facility] Surgery, [place] 03/18/2018, 6:26 PM

## 2018-03-18 NOTE — Evaluation (Signed)
Physical Therapy Evaluation Patient Details Name: Steven Allen MRN: [id number] DOB: [birth year] Today's Date: 03/18/2018   History of Present Illness  s/p lap sleeve gastrectomy and upper GI endoscopy. PMH:  DM, OA, CAD, chronic hip pain  Clinical Impression  The patient reports that he uses motorized  Chair mostly. The patient ambulated x 60' today using RW. Patient is requesting crutches. Will assess patient on crutches. Pt admitted with above diagnosis. Pt currently with functional limitations due to the deficits listed below (see PT Problem List).  Pt will benefit from skilled PT to increase their independence and safety with mobility to allow discharge to the venue listed below.       Follow Up Recommendations Home health PT    Equipment Recommendations  Crutches    Recommendations for Other Services       Precautions / Restrictions Precautions Precautions: Fall Precaution Comments: Has bil DJD hips      Mobility  Bed Mobility               General bed mobility comments: oob  Transfers Overall transfer level: Needs assistance Equipment used: Rolling walker (2 wheeled) Transfers: Sit to/from Stand Sit to Stand: Min guard         General transfer comment: for safety  Ambulation/Gait Ambulation/Gait assistance: Min assist;Min guard Gait Distance (Feet): 60 Feet Assistive device: Rolling walker (2 wheeled) Gait Pattern/deviations: Step-through pattern;Step-to pattern Gait velocity: decr   General Gait Details: patient  reported that he did not ambulate  PTA, patient did ambulate x 60'  Stairs            Wheelchair Mobility    Modified Rankin (Stroke Patients Only)       Balance                                             Pertinent Vitals/Pain Pain Assessment: 0-10 Pain Score: 9  Pain Location: bil hips with weight bearing Pain Descriptors / Indicators: Aching Pain Intervention(s): Monitored during session;Limited  activity within patient's tolerance;Repositioned    Home Living Family/patient expects to be discharged to:: Private residence Living Arrangements: Spouse/significant other   Type of Home: House Home Access: Ramped entrance     Home Layout: One level Home Equipment: Crutches Additional Comments: needs a new pair of crutches    Prior Function Level of Independence: Independent with assistive device(s)         Comments: has been using electric w/c due to  bil hip pain     Hand Dominance        Extremity/Trunk Assessment        Lower Extremity Assessment Lower Extremity Assessment: Generalized weakness    Cervical / Trunk Assessment Cervical / Trunk Assessment: Normal  Communication   Communication: No difficulties  Cognition Arousal/Alertness: Awake/alert Behavior During Therapy: WFL for tasks assessed/performed Overall Cognitive Status: Within Functional Limits for tasks assessed                                        General Comments      Exercises     Assessment/Plan    PT Assessment Patient needs continued PT services  PT Problem List Decreased strength;Decreased activity tolerance;Decreased mobility;Pain       PT Treatment Interventions DME instruction;Gait training;Functional  mobility training;Therapeutic activities;Patient/family education    PT Goals (Current goals can be found in the Care Plan section)  Acute Rehab PT Goals Patient Stated Goal: be able to have hip sx PT Goal Formulation: With patient/family Time For Goal Achievement: 03/25/18 Potential to Achieve Goals: Good    Frequency Min 2X/week   Barriers to discharge        Co-evaluation PT/OT/SLP Co-Evaluation/Treatment: Yes Reason for Co-Treatment: For patient/therapist safety PT goals addressed during session: Mobility/safety with mobility OT goals addressed during session: ADL's and self-care       AM-PAC PT "6 Clicks" Daily Activity  Outcome Measure  Difficulty turning over in bed (including adjusting bedclothes, sheets and blankets)?: Unable Difficulty moving from lying on back to sitting on the side of the bed? : Unable Difficulty sitting down on and standing up from a chair with arms (e.g., wheelchair, bedside commode, etc,.)?: A Lot Help needed moving to and from a bed to chair (including a wheelchair)?: A Little Help needed walking in hospital room?: A Little Help needed climbing 3-5 steps with a railing? : Total 6 Click Score: 11    End of Session   Activity Tolerance: Patient tolerated treatment well Patient left: in chair;with call bell/phone within reach;with family/visitor present Nurse Communication: Mobility status PT Visit Diagnosis: Unsteadiness on feet (R26.81)    Time: 1610-96041152-1204 PT Time Calculation (min) (ACUTE ONLY): 12 min   Charges:   PT Evaluation $PT Eval Low Complexity: 1 Low          Blanchard KelchKaren Isamar Nazir PT Acute Rehabilitation Services Pager [phone] Office [phone]   Rada HayHill, Amylee Lodato Elizabeth 03/18/2018, 1:26 PM

## 2018-03-18 NOTE — Progress Notes (Signed)
Patient alert and oriented, Post op day 1.  Provided support and encouragement.  Encouraged pulmonary toilet, ambulation and small sips of liquids.  Working on last cup of water.  All questions answered.  Will continue to monitor.

## 2018-03-18 NOTE — Plan of Care (Signed)

## 2018-03-18 NOTE — Progress Notes (Signed)
Physical Therapy Treatment Patient Details Name: Steven Allen MRN: [id number] DOB: 20-Dec-1960 Today's Date: 03/18/2018    History of Present Illness s/p lap sleeve gastrectomy and upper GI endoscopy. PMH:  DM, OA, CAD, chronic hip pain    PT Comments     patient pleased with progress ambulating with crutches. Instructed to not lean on axilla, relive the UE's if ambulating any distance.    Follow Up Recommendations  Home health PT     Equipment Recommendations  Crutches    Recommendations for Other Services       Precautions / Restrictions Precautions Precautions: Fall Precaution Comments: Has bil DJD hips    Mobility  Bed Mobility               General bed mobility comments: mod I wth rail  Transfers Overall transfer level: Needs assistance Equipment used: Crutches Transfers: Sit to/from Stand Sit to Stand: Supervision         General transfer comment: for safety  Ambulation/Gait Ambulation/Gait assistance: Supervision Gait Distance (Feet): 200 Feet Assistive device: Crutches Gait Pattern/deviations: Step-to pattern Gait velocity: decr   General Gait Details: 4 point gate pattern. Patient does lean on Axilla. cautiuoned patient to try using elbow extension or at least stop  frequently to relieve th axilla pressure,   Stairs             Wheelchair Mobility    Modified Rankin (Stroke Patients Only)       Balance                                            Cognition Arousal/Alertness: Awake/alert Behavior During Therapy: WFL for tasks assessed/performed Overall Cognitive Status: Within Functional Limits for tasks assessed                                        Exercises      General Comments        Pertinent Vitals/Pain Pain Assessment: 0-10 Pain Score: 3  Pain Location: bil hips with weight bearing Pain Descriptors / Indicators: Aching Pain Intervention(s): Monitored during session     Home Living Family/patient expects to be discharged to:: Private residence Living Arrangements: Spouse/significant other   Type of Home: House Home Access: Ramped entrance   Home Layout: One level Home Equipment: Crutches Additional Comments: needs a new pair of crutches    Prior Function Level of Independence: Independent with assistive device(s)      Comments: has been using electric w/c due to  bil hip pain   PT Goals (current goals can now be found in the care plan section) Acute Rehab PT Goals Patient Stated Goal: be able to have hip sx PT Goal Formulation: With patient/family Time For Goal Achievement: 03/25/18 Potential to Achieve Goals: Good Progress towards PT goals: Progressing toward goals    Frequency    Min 2X/week      PT Plan Current plan remains appropriate    Co-evaluation PT/OT/SLP Co-Evaluation/Treatment: Yes Reason for Co-Treatment: For patient/therapist safety PT goals addressed during session: Mobility/safety with mobility OT goals addressed during session: ADL's and self-care      AM-PAC PT "6 Clicks" Daily Activity  Outcome Measure  Difficulty turning over in bed (including adjusting bedclothes, sheets and blankets)?: A Little Difficulty moving from  lying on back to sitting on the side of the bed? : A Little Difficulty sitting down on and standing up from a chair with arms (e.g., wheelchair, bedside commode, etc,.)?: A Little Help needed moving to and from a bed to chair (including a wheelchair)?: A Little Help needed walking in hospital room?: A Little Help needed climbing 3-5 steps with a railing? : Total 6 Click Score: 16    End of Session   Activity Tolerance: Patient tolerated treatment well Patient left: in chair;with call bell/phone within reach Nurse Communication: Mobility status PT Visit Diagnosis: Unsteadiness on feet (R26.81)     Time: [id number] PT Time Calculation (min) (ACUTE ONLY): 9 min  Charges:  $Gait  Training: 8-22 mins                     Blanchard KelchKaren Kemari Mares PT Acute Rehabilitation Services Pager [phone] Office [phone]    Rada HayHill, Margeaux Swantek Elizabeth 03/18/2018, 1:53 PM

## 2018-03-18 NOTE — Care Management Note (Signed)
Case Management Note  Patient Details  Name: Steven Allen MRN: [id number] Date of Birth: Jul 08, 1960  Subjective/Objective:    Discharge planning, [facility] with patient at beside. [facility] to eval and treat.                 Action/Plan: Contacted [facility] for referral. They have accepted. [phone]  Expected Discharge Date:  03/18/18               Expected Discharge Plan:  Home w Home Health Services  In-House Referral:  NA  Discharge planning Services  CM Consult  Post Acute Care Choice:  Home Health Choice offered to:  Patient  DME Arranged:  N/A DME Agency:  NA  HH Arranged:  PT HH Agency:  [facility]  Status of Service:  Completed, signed off  If discussed at Long Length of Stay Meetings, dates discussed:    Additional Comments:  Alexis Goodelleele, Lylee Corrow K, RN 03/18/2018, 3:33 PM

## 2018-03-18 NOTE — Progress Notes (Signed)
Pt started drinking first 2 oz of protein at 0942.

## 2018-03-18 NOTE — Discharge Instructions (Signed)
Enoxaparin injection °What is this medicine? °ENOXAPARIN (ee nox a PA rin) is used after knee, hip, or abdominal surgeries to prevent blood clotting. It is also used to treat existing blood clots in the lungs or in the veins. °This medicine may be used for other purposes; ask your health care provider or pharmacist if you have questions. °COMMON BRAND NAME(S): Lovenox °What should I tell my health care provider before I take this medicine? °They need to know if you have any of these conditions: °-bleeding disorders, hemorrhage, or hemophilia °-infection of the heart or heart valves °-kidney or liver disease °-previous stroke °-prosthetic heart valve °-recent surgery or delivery of a baby °-ulcer in the stomach or intestine, diverticulitis, or other bowel disease °-an unusual or allergic reaction to enoxaparin, heparin, pork or pork products, other medicines, foods, dyes, or preservatives °-pregnant or trying to get pregnant °-breast-feeding °How should I use this medicine? °This medicine is for injection under the skin. It is usually given by a health-care professional. You or a family member may be trained on how to give the injections. If you are to give yourself injections, make sure you understand how to use the syringe, measure the dose if necessary, and give the injection. To avoid bruising, do not rub the site where this medicine has been injected. Do not take your medicine more often than directed. Do not stop taking except on the advice of your doctor or health care professional. °Make sure you receive a puncture-resistant container to dispose of the needles and syringes once you have finished with them. Do not reuse these items. Return the container to your doctor or health care professional for proper disposal. °Talk to your pediatrician regarding the use of this medicine in children. Special care may be needed. °Overdosage: If you think you have taken too much of this medicine contact a poison control  center or emergency room at once. °NOTE: This medicine is only for you. Do not share this medicine with others. °What if I miss a dose? °If you miss a dose, take it as soon as you can. If it is almost time for your next dose, take only that dose. Do not take double or extra doses. °What may interact with this medicine? °-aspirin and aspirin-like medicines °-certain medicines that treat or prevent blood clots °-dipyridamole °-NSAIDs, medicines for pain and inflammation, like ibuprofen or naproxen °This list may not describe all possible interactions. Give your health care provider a list of all the medicines, herbs, non-prescription drugs, or dietary supplements you use. Also tell them if you smoke, drink alcohol, or use illegal drugs. Some items may interact with your medicine. °What should I watch for while using this medicine? °Visit your doctor or health care professional for regular checks on your progress. Your condition will be monitored carefully while you are receiving this medicine. °Notify your doctor or health care professional and seek emergency treatment if you develop breathing problems; changes in vision; chest pain; severe, sudden headache; pain, swelling, warmth in the leg; trouble speaking; sudden numbness or weakness of the face, arm, or leg. These can be signs that your condition has gotten worse. °If you are going to have surgery, tell your doctor or health care professional that you are taking this medicine. °Do not stop taking this medicine without first talking to your doctor. Be sure to refill your prescription before you run out of medicine. °Avoid sports and activities that might cause injury while you are using this medicine. Severe   falls or injuries can cause unseen bleeding. Be careful when using sharp tools or knives. Consider using an electric razor. Take special care brushing or flossing your teeth. Report any injuries, bruising, or red spots on the skin to your doctor or health care  professional. °What side effects may I notice from receiving this medicine? °Side effects that you should report to your doctor or health care professional as soon as possible: °-allergic reactions like skin rash, itching or hives, swelling of the face, lips, or tongue °-feeling faint or lightheaded, falls °-signs and symptoms of bleeding such as bloody or black, tarry stools; red or dark-brown urine; spitting up blood or brown material that looks like coffee grounds; red spots on the skin; unusual bruising or bleeding from the eye, gums, or nose °Side effects that usually do not require medical attention (report to your doctor or health care professional if they continue or are bothersome): °-pain, redness, or irritation at site where injected °This list may not describe all possible side effects. Call your doctor for medical advice about side effects. You may report side effects to FDA at 1-800-FDA-1088. °Where should I keep my medicine? °Keep out of the reach of children. °Store at room temperature between 15 and 30 degrees C (59 and 86 degrees F). Do not freeze. If your injections have been specially prepared, you may need to store them in the refrigerator. Ask your pharmacist. Throw away any unused medicine after the expiration date. °NOTE: This sheet is a summary. It may not cover all possible information. If you have questions about this medicine, talk to your doctor, pharmacist, or health care provider. °© 2018 [employer]vier/Gold Standard (2013-08-18 16:06:21) ° ° ° ° °GASTRIC BYPASS/SLEEVE ° Home Care Instructions ° ° These instructions are to help you care for yourself when you go home. ° °Call: If you have any problems. °• Call [phone] and ask for the surgeon on call °• If you need immediate help, come to the ER at [facility].  °• Tell the ER staff that you are a new post-op gastric bypass or gastric sleeve patient °  °Signs and symptoms to report: • Severe vomiting or nausea °o If you cannot keep down  clear liquids for longer than 1 day, call your surgeon  °• Abdominal pain that does not get better after taking your pain medication °• Fever over 100.4° F with chills °• Heart beating over 100 beats a minute °• Shortness of breath at rest °• Chest pain °•  Redness, swelling, drainage, or foul odor at incision (surgical) sites °•  If your incisions open or pull apart °• Swelling or pain in calf (lower leg) °• Diarrhea (Loose bowel movements that happen often), frequent watery, uncontrolled bowel movements °• Constipation, (no bowel movements for 3 days) if this happens: Pick one °o Milk of Magnesia, 2 tablespoons by mouth, 3 times a day for 2 days if needed °o Stop taking Milk of Magnesia once you have a bowel movement °o Call your doctor if constipation continues °Or °o Miralax  (instead of Milk of Magnesia) following the label instructions °o Stop taking Miralax once you have a bowel movement °o Call your doctor if constipation continues °• Anything you think is not normal °  °Normal side effects after surgery: • Unable to sleep at night or unable to focus °• Irritability or moody °• Being tearful (crying) or depressed °These are common complaints, possibly related to your anesthesia medications that put you to sleep, stress of surgery,   and change in lifestyle.  This usually goes away a few weeks after surgery.  If these feelings continue, call your primary care doctor. °  °Wound Care: You may have surgical glue, steri-strips, or staples over your incisions after surgery °• Surgical glue:  Looks like a clear film over your incisions and will wear off a little at a time °• Steri-strips: Strips of tape over your incisions. You may notice a yellowish color on the skin under the steri-strips. This is used to make the   steri-strips stick better. Do not pull the steri-strips off - let them fall off °• Staples: Staples may be removed before you leave the hospital °o If you go home with staples, call [facility], [phone] at for an appointment with your surgeon’s nurse to have staples removed 10 days after surgery. °• Showering: You may shower two (2) days after your surgery unless your surgeon tells you differently °o Wash gently around incisions with warm soapy water, rinse well, and gently pat dry  °o No tub baths until staples are removed, steri-strips fall off or glue is gone.  °  °Medications: • Medications should be liquid or crushed if larger than the size of a dime °• Extended release pills (medication that release a little bit at a time through the day) should NOT be crushed or cut. (examples include XL, ER, DR, SR) °• Depending on the size and number of medications you take, you may need to space (take a few throughout the day)/change the time you take your medications so that you do not over-fill your pouch (smaller stomach) °• Make sure you follow-up with your primary care doctor to make medication changes needed during rapid weight loss and life-style changes °• If you have diabetes, follow up with the doctor that orders your diabetes medication(s) within one week after surgery and check your blood sugar regularly. °• Do not drive while taking prescription pain medication  °• It is ok to take Tylenol by the bottle instructions with your pain medicine or instead of your pain medicine as needed.  DO NOT TAKE NSAIDS (EXAMPLES OF NSAIDS:  IBUPROFREN/ NAPROXEN)  °Diet:                    First 2 Weeks ° You will see the dietician t about two (2) weeks after your surgery. The dietician will increase the types of foods you can eat if you are handling liquids well: °• If you have severe vomiting or nausea and cannot keep down clear liquids lasting longer than 1 day, call your surgeon @ ([phone]) °Protein Shake °• Drink at least 2 ounces of shake 5-6 times per day °• Each serving of protein shakes (usually 8 - 12 ounces) should have: °o 15 grams of protein  °o And no more than 5 grams of carbohydrate   °• Goal for protein each day: °o Men = 80 grams per day °o Women = 60 grams per day °• Protein powder may be added to fluids such as non-fat milk or Lactaid milk or unsweetened Soy/Almond milk (limit to 35 grams added protein powder per serving) ° °Hydration °• Slowly increase the amount of water and other clear liquids as tolerated (See Acceptable Fluids) °• Slowly increase the amount of protein shake as tolerated  °•  Sip fluids slowly and throughout the day.  Do not use straws. °• May use sugar substitutes in small amounts (no more than 6 - 8 packets per   day; i.e. Splenda) ° °Fluid Goal °• The first goal is to drink at least 8 ounces of protein shake/drink per day (or as directed by the nutritionist); some examples of protein shakes are Syntrax Nectar, Adkins Advantage, EAS Edge HP, and Unjury. See handout from pre-op Bariatric Education Class: °o Slowly increase the amount of protein shake you drink as tolerated °o You may find it easier to slowly sip shakes throughout the day °o It is important to get your proteins in first °• Your fluid goal is to drink 64 - 100 ounces of fluid daily °o It may take a few weeks to build up to this °• 32 oz (or more) should be clear liquids  °And  °• 32 oz (or more) should be full liquids (see below for examples) °• Liquids should not contain sugar, caffeine, or carbonation ° °Clear Liquids: °• Water or Sugar-free flavored water (i.e. Fruit H2O, Propel) °• Decaffeinated coffee or tea (sugar-free) °• Crystal Lite, Wyler’s Lite, Minute Maid Lite °• Sugar-free Jell-O °• Bouillon or broth °• Sugar-free Popsicle:   *Less than 20 calories each; Limit 1 per day ° °Full Liquids: °Protein Shakes/Drinks + 2 choices per day of other full liquids °• Full liquids must be: °o No More Than 15 grams of Carbs per serving  °o No More Than 3 grams of Fat per serving °• Strained low-fat cream soup (except Cream of Potato or Tomato) °• Non-Fat milk °• Fat-free Lactaid Milk °• Unsweetened Soy Or  Unsweetened Almond Milk °• Low Sugar yogurt (Dannon Lite & Fit, Greek yogurt; Oikos Triple Zero; Chobani Simply 100; Yoplait 100 calorie Greek - No Fruit on the Bottom) ° °  °Vitamins and Minerals • Start 1 day after surgery unless otherwise directed by your surgeon °• 2 Chewable Bariatric Specific Multivitamin / Multimineral Supplement with iron (Example: Bariatric Advantage Multi EA) °• Chewable Calcium with Vitamin D-3 °(Example: 3 Chewable Calcium Plus 600 with Vitamin D-3) °o Take 500 mg three (3) times a day for a total of 1500 mg each day °o Do not take all 3 doses of calcium at one time as it may cause constipation, and you can only absorb 500 mg  at a time  °o Do not mix multivitamins containing iron with calcium supplements; take 2 hours apart °• Menstruating women and those with a history of anemia (a blood disease that causes weakness) may need extra iron °o Talk with your doctor to see if you need more iron °• Do not stop taking or change any vitamins or minerals until you talk to your dietitian or surgeon °• Your Dietitian and/or surgeon must approve all vitamin and mineral supplements °  °Activity and Exercise: Limit your physical activity as instructed by your doctor.  It is important to continue walking at home.  During this time, use these guidelines: °• Do not lift anything greater than ten (10) pounds for at least two (2) weeks °• Do not go back to work or drive until your surgeon says you can °• You may have sex when you feel comfortable  °o It is VERY important for male patients to use a reliable birth control method; fertility often increases after surgery  °o All hormonal birth control will be ineffective for 30 days after surgery due to medications given during surgery a barrier method must be used. °o Do not get pregnant for at least 18 months °• Start exercising as soon as your doctor tells you that you can °o Make sure   your doctor approves any physical activity °• Start with a simple  walking program °• Walk 5-15 minutes each day, 7 days per week.  °• Slowly increase until you are walking 30-45 minutes per day °Consider joining our BELT program. [phone] or email [email or web address] °  °Special Instructions Things to remember: °• Use your CPAP when sleeping if this applies to you ° °• [facility] has two free Bariatric Surgery Support Groups that meet monthly °o The 3rd Thursday of each month, 6 pm, [facility]  °o The 2nd Friday of each month, 11:45 am in the private dining room in the basement of [facility] °• It is very important to keep all follow up appointments with your surgeon, dietitian, primary care physician, and behavioral health practitioner °• Routine follow up schedule with your surgeon include appointments at 2-3 weeks, 6-8 weeks, 6 months, and 1 year at a minimum.  Your surgeon may request to see you more often.   °o After the first year, please follow up with your bariatric surgeon and dietitian at least once a year in order to maintain best weight loss results °[facility]: [phone] °[facility]: [phone] °Bariatric Nurse Coordinator: [phone] °  °   Reviewed and Endorsed  °by [place] Patient Education Committee, June, 2016 °Edits Approved: Aug, 2018 ° ° ° °

## 2018-03-18 NOTE — Progress Notes (Addendum)
Patient alert and oriented, pain is controlled. Patient is tolerating fluids, advanced to protein shake today, patient is tolerating well.  Reviewed Gastric sleeve discharge instructions with patient and patient is able to articulate understanding.  Lovenox education completed, patient lovenox education kit discussed.  Provided information on BELT program, Support Group and [facility] outpatient pharmacy. All questions answered, will continue to monitor.   Total fluid intake 856 Per protocol call back one week post op

## 2018-03-18 NOTE — Progress Notes (Signed)
Discharge instructions given to pt and all questions were answered. Pt taken down via wheelchair and was picked up by his wife. 

## 2018-03-18 NOTE — Evaluation (Signed)
Occupational Therapy Evaluation Patient Details Name: Steven Allen MRN: [id number] DOB: Dec 21, 1960 Today's Date: 03/18/2018    History of Present Illness s/p lap sleeve gastrectomy and upper GI endoscopy. PMH:  DM, OA, CAD, chronic hip pain   Clinical Impression   This 57 year old man was admitted for the above sx. All education was completed. No further OT is needed at this time     Follow Up Recommendations  Supervision - Intermittent    Equipment Recommendations  None recommended by OT    Recommendations for Other Services       Precautions / Restrictions Precautions Precautions: Fall      Mobility Bed Mobility               General bed mobility comments: oob. Reviewed sidelying<>sit to reduce strain on incisions  Transfers Overall transfer level: Needs assistance Equipment used: Rolling walker (2 wheeled) Transfers: Sit to/from Stand Sit to Stand: Min guard         General transfer comment: for safety    Balance                                           ADL either performed or assessed with clinical judgement   ADL Overall ADL's : Needs assistance/impaired                                       General ADL Comments: pt is near baseline for adls. He usually uses power w/c to get around including into bathroom.  He wears pants and slip on crocs at baseline.  He does not wear socks and is familiar with sock aide.  Educated on use of reacher for pants, if needed, to work within pain tolerance for pants     Vision         Perception     Praxis      Pertinent Vitals/Pain Pain Assessment: 0-10 Pain Score: 9  Pain Location: bil hips with weight bearing Pain Descriptors / Indicators: Aching Pain Intervention(s): Limited activity within patient's tolerance;Monitored during session;Repositioned     Hand Dominance     Extremity/Trunk Assessment             Communication Communication Communication: No  difficulties   Cognition Arousal/Alertness: Awake/alert Behavior During Therapy: WFL for tasks assessed/performed Overall Cognitive Status: Within Functional Limits for tasks assessed                                     General Comments       Exercises     Shoulder Instructions      Home Living Family/patient expects to be discharged to:: Private residence Living Arrangements: Spouse/significant other   Type of Home: House Home Access: Ramped entrance     Home Layout: One level     Bathroom Shower/Tub: Walk-in shower;Tub/shower unit   Bathroom Toilet: Standard     Home Equipment: Environmental consultant - 2 wheels;Cane - single point;Wheelchair - manual;Grab bars - toilet;Wheelchair - power;Tub bench          Prior Functioning/Environment Level of Independence: Independent with assistive device(s)        Comments: has been using electric w/c due to  bil hip pain  OT Problem List:        OT Treatment/Interventions:      OT Goals(Current goals can be found in the care plan section) Acute Rehab OT Goals Patient Stated Goal: be able to have hip sx OT Goal Formulation: All assessment and education complete, DC therapy  OT Frequency:     Barriers to D/C:            Co-evaluation              AM-PAC PT "6 Clicks" Daily Activity     Outcome Measure Help from another person eating meals?: None Help from another person taking care of personal grooming?: A Little Help from another person toileting, which includes using toliet, bedpan, or urinal?: A Little Help from another person bathing (including washing, rinsing, drying)?: A Little Help from another person to put on and taking off regular upper body clothing?: A Little Help from another person to put on and taking off regular lower body clothing?: A Little 6 Click Score: 19   End of Session   cotx with PT:  Pt worked on mobility; OT on ADLs Activity Tolerance: Patient tolerated treatment  well Patient left: in chair;with call bell/phone within reach;with family/visitor present  OT Visit Diagnosis: Pain Pain - Right/Left: (bil) Pain - part of body: Hip                Time: 1152-1207 and 1358-1400 OT Time Calculation (min): 17 min Charges:  OT General Charges $OT Visit: 1 Visit OT Evaluation $OT Eval Low Complexity: 1 Low  Marica OtterMaryellen Johngabriel Verde, OTR/L Acute Rehabilitation Services [phone] [facility] pager [phone] office 03/18/2018  Latrece Nitta 03/18/2018, 1:11 PM

## 2018-03-22 DIAGNOSIS — I10 Essential (primary) hypertension: Secondary | ICD-10-CM | POA: Diagnosis not present

## 2018-03-22 DIAGNOSIS — I2510 Atherosclerotic heart disease of native coronary artery without angina pectoris: Secondary | ICD-10-CM | POA: Diagnosis not present

## 2018-03-22 DIAGNOSIS — Z48815 Encounter for surgical aftercare following surgery on the digestive system: Secondary | ICD-10-CM | POA: Diagnosis not present

## 2018-03-22 DIAGNOSIS — E7800 Pure hypercholesterolemia, unspecified: Secondary | ICD-10-CM | POA: Diagnosis not present

## 2018-03-22 DIAGNOSIS — G8929 Other chronic pain: Secondary | ICD-10-CM | POA: Diagnosis not present

## 2018-03-22 DIAGNOSIS — E1169 Type 2 diabetes mellitus with other specified complication: Secondary | ICD-10-CM | POA: Diagnosis not present

## 2018-03-22 DIAGNOSIS — R32 Unspecified urinary incontinence: Secondary | ICD-10-CM | POA: Diagnosis not present

## 2018-03-22 DIAGNOSIS — M160 Bilateral primary osteoarthritis of hip: Secondary | ICD-10-CM | POA: Diagnosis not present

## 2018-03-22 DIAGNOSIS — E538 Deficiency of other specified B group vitamins: Secondary | ICD-10-CM | POA: Diagnosis not present

## 2018-03-24 ENCOUNTER — Telehealth (HOSPITAL_COMMUNITY): Payer: Self-pay

## 2018-03-24 DIAGNOSIS — Z9884 Bariatric surgery status: Secondary | ICD-10-CM | POA: Diagnosis not present

## 2018-03-24 DIAGNOSIS — Z713 Dietary counseling and surveillance: Secondary | ICD-10-CM | POA: Diagnosis not present

## 2018-03-24 DIAGNOSIS — Z7182 Exercise counseling: Secondary | ICD-10-CM | POA: Diagnosis not present

## 2018-03-24 DIAGNOSIS — Z6841 Body Mass Index (BMI) 40.0 and over, adult: Secondary | ICD-10-CM | POA: Diagnosis not present

## 2018-03-24 DIAGNOSIS — Z7189 Other specified counseling: Secondary | ICD-10-CM | POA: Diagnosis not present

## 2018-03-24 DIAGNOSIS — E119 Type 2 diabetes mellitus without complications: Secondary | ICD-10-CM | POA: Diagnosis not present

## 2018-03-24 NOTE — Telephone Encounter (Signed)
Patient called to discuss post bariatric surgery follow up questions.  See below:   1.  Tell me about your pain and pain management?taking medication a few times it did help  2.  Let's talk about fluid intake.  How much total fluid are you taking in?50+ ounces per day  3.  How much protein have you taken in the last 2 days?51+ grams of protein daily  4.  Have you had nausea?  Tell me about when have experienced nausea and what you did to help?taking nausea medication and helped when taken  5.  Has the frequency or color changed with your urine?urine clear no problems  6.  Tell me what your incisions look like?incisions are clean dry and intact  7.  Have you been passing gas? BM?passing gas no bms  8.  If a problem or question were to arise who would you call?  Do you know contact numbers for [facility], [facility], and [facility]?aware of how to contact all services  9.  How has the walking going?moving around as much as possible  10.  How are your vitamins and calcium going?  How are you taking them?no problems taking as directed at discharge  Reminded of appt with dietitian next week on Dec 3rd

## 2018-03-25 DIAGNOSIS — I10 Essential (primary) hypertension: Secondary | ICD-10-CM | POA: Diagnosis not present

## 2018-03-25 DIAGNOSIS — R32 Unspecified urinary incontinence: Secondary | ICD-10-CM | POA: Diagnosis not present

## 2018-03-25 DIAGNOSIS — E1169 Type 2 diabetes mellitus with other specified complication: Secondary | ICD-10-CM | POA: Diagnosis not present

## 2018-03-25 DIAGNOSIS — E538 Deficiency of other specified B group vitamins: Secondary | ICD-10-CM | POA: Diagnosis not present

## 2018-03-25 DIAGNOSIS — G8929 Other chronic pain: Secondary | ICD-10-CM | POA: Diagnosis not present

## 2018-03-25 DIAGNOSIS — Z48815 Encounter for surgical aftercare following surgery on the digestive system: Secondary | ICD-10-CM | POA: Diagnosis not present

## 2018-03-25 DIAGNOSIS — M160 Bilateral primary osteoarthritis of hip: Secondary | ICD-10-CM | POA: Diagnosis not present

## 2018-03-25 DIAGNOSIS — I2510 Atherosclerotic heart disease of native coronary artery without angina pectoris: Secondary | ICD-10-CM | POA: Diagnosis not present

## 2018-03-25 DIAGNOSIS — E7800 Pure hypercholesterolemia, unspecified: Secondary | ICD-10-CM | POA: Diagnosis not present

## 2018-04-01 ENCOUNTER — Encounter: Payer: Medicare HMO | Attending: General Surgery | Admitting: Skilled Nursing Facility1

## 2018-04-01 DIAGNOSIS — E669 Obesity, unspecified: Secondary | ICD-10-CM

## 2018-04-01 DIAGNOSIS — Z713 Dietary counseling and surveillance: Secondary | ICD-10-CM | POA: Diagnosis not present

## 2018-04-01 DIAGNOSIS — E119 Type 2 diabetes mellitus without complications: Secondary | ICD-10-CM | POA: Insufficient documentation

## 2018-04-01 DIAGNOSIS — E1169 Type 2 diabetes mellitus with other specified complication: Secondary | ICD-10-CM

## 2018-04-01 DIAGNOSIS — Z6841 Body Mass Index (BMI) 40.0 and over, adult: Secondary | ICD-10-CM | POA: Diagnosis not present

## 2018-04-02 DIAGNOSIS — E7800 Pure hypercholesterolemia, unspecified: Secondary | ICD-10-CM | POA: Diagnosis not present

## 2018-04-02 DIAGNOSIS — G8929 Other chronic pain: Secondary | ICD-10-CM | POA: Diagnosis not present

## 2018-04-02 DIAGNOSIS — M160 Bilateral primary osteoarthritis of hip: Secondary | ICD-10-CM | POA: Diagnosis not present

## 2018-04-02 DIAGNOSIS — Z48815 Encounter for surgical aftercare following surgery on the digestive system: Secondary | ICD-10-CM | POA: Diagnosis not present

## 2018-04-02 DIAGNOSIS — R32 Unspecified urinary incontinence: Secondary | ICD-10-CM | POA: Diagnosis not present

## 2018-04-02 DIAGNOSIS — I10 Essential (primary) hypertension: Secondary | ICD-10-CM | POA: Diagnosis not present

## 2018-04-02 DIAGNOSIS — E1169 Type 2 diabetes mellitus with other specified complication: Secondary | ICD-10-CM | POA: Diagnosis not present

## 2018-04-02 DIAGNOSIS — E538 Deficiency of other specified B group vitamins: Secondary | ICD-10-CM | POA: Diagnosis not present

## 2018-04-02 DIAGNOSIS — I2510 Atherosclerotic heart disease of native coronary artery without angina pectoris: Secondary | ICD-10-CM | POA: Diagnosis not present

## 2018-04-03 ENCOUNTER — Encounter: Payer: Self-pay | Admitting: Skilled Nursing Facility1

## 2018-04-03 NOTE — Progress Notes (Signed)
Bariatric Class:  Appt start time: 1530 end time:  1630.  2 Week Post-Operative Nutrition Class  Patient was seen on 04/01/2018 for [facility]ative [facility]   Pts stated A1C 5.7 checking blood sugars 3 times a day averaging 100-110.  Surgery date: 03/17/2018 Surgery type: sleeve Start weight at [facility]: 360.6 Weight today: 315.4  TANITA  BODY COMP RESULTS  Declined   BMI (kg/m^2)    Fat Mass (lbs)    Fat Free Mass (lbs)    Total Body Water (lbs)    The following the learning objectives were met by the patient during this course:  Identifies Phase 3A (Soft, High Proteins) Dietary Goals and will begin from 2 weeks post-operatively to 2 months post-operatively  Identifies appropriate sources of fluids and proteins   States protein recommendations and appropriate sources post-operatively  Identifies the need for appropriate texture modifications, mastication, and bite sizes when consuming solids  Identifies appropriate multivitamin and calcium sources post-operatively  Describes the need for physical activity post-operatively and will follow MD recommendations  States when to call healthcare provider regarding medication questions or post-operative complications  Handouts given during class include:  Phase 3A: Soft, High Protein Diet Handout  Follow-Up Plan: Patient will follow-up at [facility] in 6 weeks for 2 month post-op nutrition visit for diet advancement per MD.

## 2018-04-04 DIAGNOSIS — R32 Unspecified urinary incontinence: Secondary | ICD-10-CM | POA: Diagnosis not present

## 2018-04-04 DIAGNOSIS — E7800 Pure hypercholesterolemia, unspecified: Secondary | ICD-10-CM | POA: Diagnosis not present

## 2018-04-04 DIAGNOSIS — G8929 Other chronic pain: Secondary | ICD-10-CM | POA: Diagnosis not present

## 2018-04-04 DIAGNOSIS — E538 Deficiency of other specified B group vitamins: Secondary | ICD-10-CM | POA: Diagnosis not present

## 2018-04-04 DIAGNOSIS — M160 Bilateral primary osteoarthritis of hip: Secondary | ICD-10-CM | POA: Diagnosis not present

## 2018-04-04 DIAGNOSIS — I10 Essential (primary) hypertension: Secondary | ICD-10-CM | POA: Diagnosis not present

## 2018-04-04 DIAGNOSIS — I2510 Atherosclerotic heart disease of native coronary artery without angina pectoris: Secondary | ICD-10-CM | POA: Diagnosis not present

## 2018-04-04 DIAGNOSIS — E1169 Type 2 diabetes mellitus with other specified complication: Secondary | ICD-10-CM | POA: Diagnosis not present

## 2018-04-04 DIAGNOSIS — Z48815 Encounter for surgical aftercare following surgery on the digestive system: Secondary | ICD-10-CM | POA: Diagnosis not present

## 2018-04-07 ENCOUNTER — Telehealth: Payer: Self-pay | Admitting: Skilled Nursing Facility1

## 2018-04-07 DIAGNOSIS — Z713 Dietary counseling and surveillance: Secondary | ICD-10-CM | POA: Diagnosis not present

## 2018-04-07 DIAGNOSIS — Z7182 Exercise counseling: Secondary | ICD-10-CM | POA: Diagnosis not present

## 2018-04-07 DIAGNOSIS — I10 Essential (primary) hypertension: Secondary | ICD-10-CM | POA: Diagnosis not present

## 2018-04-07 DIAGNOSIS — Z7189 Other specified counseling: Secondary | ICD-10-CM | POA: Diagnosis not present

## 2018-04-07 DIAGNOSIS — E119 Type 2 diabetes mellitus without complications: Secondary | ICD-10-CM | POA: Diagnosis not present

## 2018-04-07 DIAGNOSIS — Z6841 Body Mass Index (BMI) 40.0 and over, adult: Secondary | ICD-10-CM | POA: Diagnosis not present

## 2018-04-07 DIAGNOSIS — E785 Hyperlipidemia, unspecified: Secondary | ICD-10-CM | POA: Diagnosis not present

## 2018-04-07 NOTE — Telephone Encounter (Signed)
RD called pt to verify fluid intake once starting soft, solid proteins 2 week post-bariatric surgery.   Daily Fluid intake: Daily Protein intake:  Concerns/issues:   LVM 

## 2018-04-09 DIAGNOSIS — G8929 Other chronic pain: Secondary | ICD-10-CM | POA: Diagnosis not present

## 2018-04-09 DIAGNOSIS — I2510 Atherosclerotic heart disease of native coronary artery without angina pectoris: Secondary | ICD-10-CM | POA: Diagnosis not present

## 2018-04-09 DIAGNOSIS — I10 Essential (primary) hypertension: Secondary | ICD-10-CM | POA: Diagnosis not present

## 2018-04-09 DIAGNOSIS — Z48815 Encounter for surgical aftercare following surgery on the digestive system: Secondary | ICD-10-CM | POA: Diagnosis not present

## 2018-04-09 DIAGNOSIS — R32 Unspecified urinary incontinence: Secondary | ICD-10-CM | POA: Diagnosis not present

## 2018-04-09 DIAGNOSIS — E538 Deficiency of other specified B group vitamins: Secondary | ICD-10-CM | POA: Diagnosis not present

## 2018-04-09 DIAGNOSIS — E1169 Type 2 diabetes mellitus with other specified complication: Secondary | ICD-10-CM | POA: Diagnosis not present

## 2018-04-09 DIAGNOSIS — E7800 Pure hypercholesterolemia, unspecified: Secondary | ICD-10-CM | POA: Diagnosis not present

## 2018-04-09 DIAGNOSIS — M160 Bilateral primary osteoarthritis of hip: Secondary | ICD-10-CM | POA: Diagnosis not present

## 2018-05-14 ENCOUNTER — Encounter: Payer: Self-pay | Admitting: Skilled Nursing Facility1

## 2018-05-14 ENCOUNTER — Encounter: Payer: Medicare HMO | Attending: General Surgery | Admitting: Skilled Nursing Facility1

## 2018-05-14 DIAGNOSIS — Z713 Dietary counseling and surveillance: Secondary | ICD-10-CM | POA: Diagnosis not present

## 2018-05-14 DIAGNOSIS — Z6841 Body Mass Index (BMI) 40.0 and over, adult: Secondary | ICD-10-CM | POA: Diagnosis not present

## 2018-05-14 DIAGNOSIS — E1169 Type 2 diabetes mellitus with other specified complication: Secondary | ICD-10-CM

## 2018-05-14 DIAGNOSIS — E119 Type 2 diabetes mellitus without complications: Secondary | ICD-10-CM | POA: Insufficient documentation

## 2018-05-14 DIAGNOSIS — E669 Obesity, unspecified: Secondary | ICD-10-CM

## 2018-05-14 NOTE — Progress Notes (Signed)
Follow-up visit:  8 Weeks Post-Operative Sleeve Surgery  Primary concerns today: Post-operative Bariatric Surgery Nutrition Management.  Pt states he ate too much and ate a doughnut and vomited.  Pt states he will start water aerobics.  Pt has a learning curve to work through with cooking his foods without animal fat and not eating carbohydrates but he is making strides to those changes.   Surgery date: 03/17/2018 Surgery type: sleeve Start weight at [facility]: 360.6 Weight today: 302 Weight change: 13 pounds  TANITA  BODY COMP RESULTS  05/14/2018   BMI (kg/m^2) 43.3   Fat Mass (lbs) 187.6   Fat Free Mass (lbs) 114.4   Total Body Water (lbs) N/A   24-hr recall: B (AM): 1 sausage and 1 egg cooked in coconut oil or protein shake Snk (AM):  L (PM): chicken soup protein powder Snk (PM):  D (PM): fried chicken with the bun Snk (PM):   Fluid intake: 84 ounces water also sometimes decaf coffee  Estimated total protein intake: 80+  Medications: see list Supplementation: multi without iron and calcium   CBG monitoring: no longer checking Average CBG per patient:  Last patient reported A1c:   Using straws: no Drinking while eating: no Having you been chewing well:no Chewing/swallowing difficulties: no Changes in vision: no Changes to mood/headaches: no Hair loss/Cahnges to skin/Changes to nails: no Any difficulty focusing or concentrating: no Sweating: no Dizziness/Lightheaded:  Palpitations: no  Carbonated beverages: no N/V/D/C/GAS: no Abdominal Pain: no Dumping syndrome: no  Recent physical activity:  ADL's  Progress Towards Goal(s):  In progress.  Handouts given during visit include:  Non starchy + protein    Nutritional Diagnosis:  -3.3 Overweight/obesity related to past poor dietary habits and physical inactivity as evidenced by patient w/ recent sleeve surgery following dietary guidelines for continued weight loss.    Intervention:  Nutrition counseling. Pts  diet was advanced to the next phase now including non starchy veggies. Due to the bodies need for essential vitamins, minerals, and fats the pt was educated on the need to consume a certain amount of calories as well as certain nutrients daily. Pt was educated on the need for daily physical activity and to reach a goal of at least 150 minutes of moderate to vigorous physical activity as directed by their physician due to such benefits as increased musculature and improved lab values.   Goals: Do not cook with coconut oil or bacon grease; use olive oil, canola oil, or vegetable oil Do not eat fried breaded foods  Do not eat buns or bread [employer] Multivitamin capsule: have food on your stomach before taking it -Continue to aim for a minimum of 64 fluid ounces 7 days a week with at least 30 ounces being plain water -Eat non-starchy vegetables 2 times a day 7 days a week -Start out with soft cooked vegetables today and tomorrow; if tolerated begin to eat raw vegetables or cooked including salads -Eat your 3 ounces of protein first then start in on your non-starchy vegetables; once you understand how much of your meal leads to satisfaction and not full while still eating 3 ounces of protein and non-starchy vegetables you can eat them in any order  -Continue to aim for 30 minutes of activity at least 5 times a week  Teaching Method Utilized:  Visual Auditory Hands on  Barriers to learning/adherence to lifestyle change: contemplative stage of change  Demonstrated degree of understanding via:  Teach Back   Monitoring/Evaluation:  Dietary intake,  exercise, and body weight. Follow up in 3 months

## 2018-05-14 NOTE — Patient Instructions (Addendum)
Do not cook with coconut oil or bacon grease; use olive oil, canola oil, or vegetable oil  Do not eat fried breaded foods   Do not eat buns or bread  [employer] Multivitamin capsule: have food on your stomach before taking it  -Continue to aim for a minimum of 64 fluid ounces 7 days a week with at least 30 ounces being plain water  -Eat non-starchy vegetables 2 times a day 7 days a week  -Start out with soft cooked vegetables today and tomorrow; if tolerated begin to eat raw vegetables or cooked including salads  -Eat your 3 ounces of protein first then start in on your non-starchy vegetables; once you understand how much of your meal leads to satisfaction and not full while still eating 3 ounces of protein and non-starchy vegetables you can eat them in any order   -Continue to aim for 30 minutes of activity at least 5 times a week

## 2018-08-12 ENCOUNTER — Ambulatory Visit: Payer: Self-pay

## 2018-08-13 ENCOUNTER — Other Ambulatory Visit: Payer: Self-pay

## 2018-08-13 ENCOUNTER — Encounter: Payer: Medicare HMO | Attending: General Surgery | Admitting: Skilled Nursing Facility1

## 2018-08-13 DIAGNOSIS — Z713 Dietary counseling and surveillance: Secondary | ICD-10-CM | POA: Diagnosis not present

## 2018-08-13 DIAGNOSIS — E119 Type 2 diabetes mellitus without complications: Secondary | ICD-10-CM | POA: Insufficient documentation

## 2018-08-13 DIAGNOSIS — E669 Obesity, unspecified: Secondary | ICD-10-CM

## 2018-08-13 DIAGNOSIS — Z6841 Body Mass Index (BMI) 40.0 and over, adult: Secondary | ICD-10-CM | POA: Insufficient documentation

## 2018-08-13 NOTE — Patient Instructions (Signed)
-  Aim to have non starchy vegetables 2 times every day   -Take your multivitamin and calcium at least 2 hours a part   -Speak with your doctor about your extra Vitamin B12 and extra Vitamin D  -Work with your resistance bands for 5 minutes every day

## 2018-08-13 NOTE — Progress Notes (Signed)
Follow-up visit: Post-Operative Sleeve Surgery  Primary concerns today: Post-operative Bariatric Surgery Nutrition Management.  Pt is unfamiliar with how his food is cooked and what it is cooked in due to his wife making his food. Pt states he eats whatever he wants. Pt states he has overeaten a few times. Pt states he is taking his multivitamin and calcium at the same time. Pt states he has been taking a vitamin D and B12 supplement for years: Dietitian advised pt to speak with his doctor about the extra supplements and to check his labs to see if his levels are WNL. Pt is resistant to being physical activity and states all he is interested in doing is water aerobics (due to Covid19 that is currently not an option).   Surgery date: 03/17/2018 Surgery type: sleeve [facility]: [facility].6 Weight today: 284.2 Weight change: 17.8 pounds  Body Composition Scale 08/13/2018  Total Body Fat % 39.5  Visceral Fat 32  Fat-Free Mass % 60.4   Total Body Water % 41.4   Muscle-Mass lbs 53.6  Body Fat Displacement          Torso  lbs 69.7         Left Leg  lbs 13.9         Right Leg  lbs 13.9         Left Arm  lbs 6.9         Right Arm   lbs 6.9    24-hr recall: B (AM): canadian bacon english muffin with egg and low fat cheese  Snk (AM):  L (PM): steak with roasted brussel sprouts  Snk (PM):  D (PM): protein shake  Snk (PM): cashews  Fluid intake: 64+ ounces water also sometimes decaf coffee  Estimated total protein intake: 80+  Medications: see list Supplementation: multi without iron and 2 calcium   CBG monitoring: no longer checking Average CBG per patient:  Last patient reported A1c:   Using straws: no Drinking while eating: no Having you been chewing well:no Chewing/swallowing difficulties: no Changes in vision: no Changes to mood/headaches: no Hair loss/Cahnges to skin/Changes to nails: no Any difficulty focusing or concentrating: no Sweating:  no Dizziness/Lightheaded: no Palpitations: no  Carbonated beverages: no N/V/D/C/GAS: no Abdominal Pain: no Dumping syndrome: no  Recent physical activity:  ADL's  Progress Towards Goal(s):  In progress.  Handouts given during visit include: Meal ideas Snack ideas Benefits of exercise    Nutritional Diagnosis:  [place]-3.3 Overweight/obesity related to past poor dietary habits and physical inactivity as evidenced by patient w/ recent sleeve surgery following dietary guidelines for continued weight loss.    Intervention:  Nutrition counseling. Pts diet was advanced to the next phase now including non starchy veggies. Due to the bodies need for essential vitamins, minerals, and fats the pt was educated on the need to consume a certain amount of calories as well as certain nutrients daily. Pt was educated on the need for daily physical activity and to reach a goal of at least 150 minutes of moderate to vigorous physical activity as directed by their physician due to such benefits as increased musculature and improved lab values.  Due to getting to be further out from surgery pt was educated on him needing to know where his satisfaction is verses fullness which will be challenging.  Goals: -Aim to have non starchy vegetables 2 times every day  -Take your multivitamin and calcium at least 2 hours a part  -Speak with your doctor  about your extra Vitamin B12 and extra Vitamin D -Work with your resistance bands for 5 minutes every day Teaching Method Utilized:  Visual Auditory Hands on  Barriers to learning/adherence to lifestyle change: contemplative stage of change  Demonstrated degree of understanding via:  Teach Back   Monitoring/Evaluation:  Dietary intake, exercise, and body weight. Follow up in 3 months

## 2018-10-27 ENCOUNTER — Telehealth: Payer: Self-pay | Admitting: Internal Medicine

## 2018-10-27 NOTE — Telephone Encounter (Signed)
   [facility] Pre-operative Risk Assessment    Request for surgical clearance:  1. What type of surgery is being performed? Left Total Arthroplasty   2. When is this surgery scheduled?  11/25/2018   3. What type of clearance is required (medical clearance vs. Pharmacy clearance to hold med vs. Both)?  Medical  4. Are there any medications that need to be held prior to surgery and how long? Not listed   5. Practice name and name of physician performing surgery? [facility]   6. What is your office phone number? [phone]    7.   What is your office fax number? [place]  8.   Anesthesia type (None, local, MAC, general) ? Spinal  _________________________________________________________________   (provider comments below)

## 2018-10-28 NOTE — Telephone Encounter (Signed)
Message sent to scheduling for appointment with Dr. Saunders Revel. Message routed to requesting provider via Epic Fax.

## 2018-10-28 NOTE — Progress Notes (Signed)
Follow-up Outpatient Visit Date: 10/29/2018  Primary Care Provider: [name], [place] [address]  Chief Complaint: Preop evaluation  HPI:  Mr. Kernen is a 58 y.o. year-old male with history of hypertension, hyperlipidemia, type 2 diabetes mellitus, morbid obesity, and incisional hernia repair, who presents for preoperative cardiovascular risk assessment.  I met him a year ago for preop evaluation in anticipation of bariatric surgery.  At that time, he was feeling well but was wheelchair bound.  Subsequent myocardial perfusion stress test was intermediate risk with a large, fixed apical defect suggestive of scar versus artifact.  LVEF was 53%.  Subsequent cardiac CTA showed coronary calcium score of 433.  Borderline significant disease was noted in the LAD and RCA by CT FFR (distal LAD 0.88 and distal RCA 0.84).  Mr. Jerline Pain underwent bariatric surgery last year and has already lost 60 pounds.  Up until the COVID-19 pandemic, he was exercising regularly with water aerobics.  He now hopes to undergo bilateral hip replacements in a staged fashion by Dr. Alvan Dame  Mr. Sherk has been feeling well.  He denies chest pain, shortness of breath, palpitations, lightheadedness, orthopnea, and edema.  He happily reports that he has been able to come off of his diabetes medications with the aforementioned weight loss.  His mobility remains limited due to bilateral hip pain, though he is able to walk short distances since undergoing bariatric surgery.  --------------------------------------------------------------------------------------------------  Cardiovascular History & Procedures: Cardiovascular Problems:  Nonobstructive coronary artery disease  Risk Factors:  Hypertension, hyperlipidemia, type 2 diabetes mellitus, obesity, male gender, and age greater than 62  Cath/PCI:  None  CV Surgery:  None  EP Procedures and Devices:  None  Non-Invasive  Evaluation(s):  Cardiac CTA (01/15/2018): LMCA normal.  LAD with 50% mixed plaque in proximal and mid vessle there is 50-75% stenosis in the disal LAD (CT FFR 0.88).  LCx with <30% mixed plaque in the proximal and mid vessel.  RCA with 30% mixed plaque throughout (CT FFR 0.84).  Pharmacologic MPI (11/22/2017): Intermediate risk study with large, fixed defect at the apex.  LVEF 53% with apical hypokinesis.  Recent CV Pertinent Labs: Lab Results  Component Value Date   K 4.1 03/18/2018   BUN 13 03/18/2018   BUN 13 12/18/2017   CREATININE 0.83 03/18/2018    Past medical and surgical history were reviewed and updated in EPIC.  Current Meds  Medication Sig  . atorvastatin (LIPITOR) 20 MG tablet Take 20 mg by mouth at bedtime.   . calcium-vitamin D (OSCAL WITH D) 500-200 MG-UNIT tablet Take 1 tablet by mouth daily with breakfast.  . losartan (COZAAR) 50 MG tablet Take 50 mg by mouth daily.  . Multiple Vitamin (MULTIVITAMIN) tablet Take 1 tablet by mouth daily.  . naproxen (NAPROSYN) 500 MG tablet Take 500 mg by mouth daily.    Allergies: Patient has no known allergies.  Social History   Tobacco Use  . Smoking status: Never Smoker  . Smokeless tobacco: Never Used  Substance Use Topics  . Alcohol use: Never    Frequency: Never  . Drug use: Never    Family History  Problem Relation Age of Onset  . Cancer Other   . Hypertension Other   . Diabetes Other   . Cancer Father        Liver    Review of Systems: A 12-system review of systems was performed and was negative except as noted in the HPI.  --------------------------------------------------------------------------------------------------  Physical Exam: BP 110/60 (BP Location: Right Arm, Patient Position: Sitting, Cuff Size: Large)   Pulse 66   Ht '5\' 10"'$  (1.778 m)   Wt 280 lb (127 kg)   BMI 40.18 kg/m   General:  NAD HEENT: No conjunctival pallor or scleral icterus.  Face mask in place. Neck: Supple without  lymphadenopathy, thyromegaly, JVD, or HJR. Lungs: Normal work of breathing. Clear to auscultation bilaterally without wheezes or crackles. Heart: Regular rate and rhythm without murmurs, rubs, or gallops.  Unable to assess PMI due to body habitus. Abd: Bowel sounds present. Soft, NT/ND.  Unable to assess HSM due to body habitus. Ext: Trace pretibial edema bilaterally. Skin: Warm and dry without rash.  EKG: Normal sinus rhythm with low voltage, inferior Q waves, and poor R wave progression.  No significant change from prior tracing on 10/25/2017.  Lab Results  Component Value Date   WBC 8.3 03/18/2018   HGB 13.7 03/18/2018   HCT 44.0 03/18/2018   MCV 93.6 03/18/2018   PLT 175 03/18/2018    Lab Results  Component Value Date   NA 138 03/18/2018   K 4.1 03/18/2018   CL 104 03/18/2018   CO2 23 03/18/2018   BUN 13 03/18/2018   CREATININE 0.83 03/18/2018   GLUCOSE 145 (H) 03/18/2018   ALT 61 (H) 03/18/2018    No results found for: CHOL, HDL, LDLCALC, LDLDIRECT, TRIG, CHOLHDL  --------------------------------------------------------------------------------------------------  ASSESSMENT AND PLAN: Coronary artery disease: Mr. Quast does not have any symptoms of coronary insufficiency.  Myocardial perfusion stress test last year was abnormal with subsequent cardiac CTA showing moderate, nonobstructive disease involving the distal LAD and RCA.  Though Mr. Vora exertional capacity remains limited.  I do not believe that he would benefit from any further testing or intervention at this time other than addition of aspirin 81 mg daily.  Given upcoming surgery, this could be deferred until after his hip replacement.  He should remain on statin therapy with goal LDL less than 70.  Hypertension: Blood pressure adequately controlled today.  Continue losartan 50 mg daily.  Preoperative risk assessment: Mr. Magallon does not report any unstable cardiac symptoms.  Though his mobility is limited,  he was performing water aerobics on a regular basis up until 3 months ago without difficulty.  Extensive cardiac work-up within the last year showed moderate, nonobstructive CAD.  Mr. Perry is a moderate risk for perioperative complications.  I do not believe that additional testing or cardiac intervention will mitigate his perioperative risk; he can proceed with hip replacements as planned.  I would advocate for addition of aspirin 81 mg daily when it is felt safe to do so from a perioperative standpoint.  Follow-up: Return to clinic in 1 year.Harrell Gave Teegan Brandis, MD 10/30/2018 10:35 AM

## 2018-10-28 NOTE — Telephone Encounter (Signed)
   Primary Cardiologist:Christopher End, MD  Chart reviewed as part of pre-operative protocol coverage. Because of Sayeed Weatherall past medical history and time since last visit, he/she will require a follow-up visit in order to better assess preoperative cardiovascular risk.  Pre-op covering staff: - Please schedule appointment and call patient to inform them. - Please contact requesting surgeon's office via preferred method (i.e, phone, fax) to inform them of need for appointment prior to surgery.  If applicable, this message will also be routed to pharmacy pool and/or primary cardiologist for input on holding anticoagulant/antiplatelet agent as requested below so that this information is available at time of patient's appointment.   [place], PA  10/28/2018, 8:03 AM

## 2018-10-29 ENCOUNTER — Ambulatory Visit: Payer: Medicare HMO | Admitting: Internal Medicine

## 2018-10-29 ENCOUNTER — Other Ambulatory Visit: Payer: Self-pay

## 2018-10-29 ENCOUNTER — Encounter: Payer: Self-pay | Admitting: Internal Medicine

## 2018-10-29 VITALS — BP 110/60 | HR 66 | Ht 70.0 in | Wt 280.0 lb

## 2018-10-29 DIAGNOSIS — Z01810 Encounter for preprocedural cardiovascular examination: Secondary | ICD-10-CM

## 2018-10-29 DIAGNOSIS — I10 Essential (primary) hypertension: Secondary | ICD-10-CM

## 2018-10-29 DIAGNOSIS — I2510 Atherosclerotic heart disease of native coronary artery without angina pectoris: Secondary | ICD-10-CM

## 2018-10-29 NOTE — Patient Instructions (Signed)
Medication Instructions:  Your physician has recommended you make the following change in your medication:  1- After your upcoming surgery and once surgeon says it is ok,  START Aspirin 81 mg by mouth once a day.   If you need a refill on your cardiac medications before your next appointment, please call your pharmacy.   Lab work: - None ordered.  If you have labs (blood work) drawn today and your tests are completely normal, you will receive your results only by: Marland Kitchen MyChart Message (if you have MyChart) OR . A paper copy in the mail If you have any lab test that is abnormal or we need to change your treatment, we will call you to review the results.  Testing/Procedures: - None ordered.   Follow-Up: At [facility], you and your health needs are our priority.  As part of our continuing mission to provide you with exceptional heart care, we have created designated Provider Care Teams.  These Care Teams include your primary Cardiologist (physician) and Advanced Practice Providers (APPs -  Physician Assistants and Nurse Practitioners) who all work together to provide you with the care you need, when you need it. You will need a follow up appointment in 12 months.  Please call our office 2 months in advance to schedule this appointment.  You may see Nelva Bush, MD or one of the following Advanced Practice Providers on your designated Care Team:   Murray Hodgkins, NP Christell Faith, PA-C . Marrianne Mood, PA-C

## 2018-10-30 DIAGNOSIS — I2510 Atherosclerotic heart disease of native coronary artery without angina pectoris: Secondary | ICD-10-CM | POA: Insufficient documentation

## 2018-10-30 NOTE — Telephone Encounter (Signed)
   Primary Cardiologist: Nelva Bush, MD  Chart reviewed as part of pre-operative protocol coverage. Given past medical history and time since last visit, based on ACC/AHA guidelines, Steven Allen would be at acceptable risk for the planned procedure without further cardiovascular testing.   OK to hold aspirin if needed pre op, resume when safe post op.  I will route this recommendation to the requesting party via Epic fax function and remove from pre-op pool.  Please call with questions.  Kerin Ransom, PA-C 10/30/2018, 10:49 AM

## 2018-11-12 ENCOUNTER — Other Ambulatory Visit: Payer: Self-pay

## 2018-11-12 ENCOUNTER — Encounter: Payer: Medicare HMO | Attending: General Surgery | Admitting: Skilled Nursing Facility1

## 2018-11-12 DIAGNOSIS — Z6841 Body Mass Index (BMI) 40.0 and over, adult: Secondary | ICD-10-CM | POA: Insufficient documentation

## 2018-11-12 DIAGNOSIS — E669 Obesity, unspecified: Secondary | ICD-10-CM

## 2018-11-12 NOTE — H&P (Signed)
TOTAL HIP ADMISSION H&P  Patient is admitted for left total hip arthroplasty, anterior approach.  Subjective:  Chief Complaint: Left hip primary OA / pain  HPI: Steven Allen, 58 y.o. male, has a history of pain and functional disability in the left hip(s) due to arthritis and patient has failed non-surgical conservative treatments for greater than 12 weeks to include NSAID's and/or analgesics, corticosteriod injections, use of assistive devices and activity modification.  Onset of symptoms was gradual starting 2+ years ago with gradually worsening course since that time.The patient noted no past surgery on the left hip(s).  Patient currently rates pain in the left hip at 9 out of 10 with activity. Patient has night pain, worsening of pain with activity and weight bearing, trendelenberg gait, pain that interfers with activities of daily living and pain with passive range of motion. Patient has evidence of periarticular osteophytes and joint space narrowing by imaging studies. This condition presents safety issues increasing the risk of falls.  There is no current active infection.  Risks, benefits and expectations were discussed with the patient.  Risks including but not limited to the risk of anesthesia, blood clots, nerve damage, blood vessel damage, failure of the prosthesis, infection and up to and including death.  Patient understand the risks, benefits and expectations and wishes to proceed with surgery.   PCP: [name], PA  D/C Plans:       Home   Post-op Meds:       No Rx given  Tranexamic Acid:      To be given - IV   Decadron:      Is to be given  FYI:      ASA  [place]  DME:   Pt already has equipment   PT:   No PT   [facility] [place], [place], [place]    Patient Active Problem List   Diagnosis Date Noted  . Coronary artery disease involving native coronary artery of native heart without angina pectoris 10/30/2018  . Severe obesity (BMI >= 40) ([place])  03/17/2018  . Diabetes mellitus type 2 in obese ([place]) 03/17/2018  . Hypercholesterolemia without hypertriglyceridemia 03/17/2018  . Bilateral primary osteoarthritis of hip 03/17/2018  . Non-occlusive coronary artery disease 03/17/2018  . Severe obesity ([place]) 03/17/2018  . Essential hypertension 10/26/2017  . Preop cardiovascular exam 10/26/2017   Past Medical History:  Diagnosis Date  . Body mass index (BMI) 45.0-49.9, adult ([place])   . Chronic hip pain   . Coronary artery disease    cardiology -- dr end-- nuclear stress test 11-22-2017 showed abnormal with intermediate risk , recommended coronary CTA and if no obstruction pt cleared for surgery (per final result non-obstructve cad in distal LAD and RCA  . Decreased strength of upper extremity   . Exercise counseling   . Full dentures   . Hyperlipidemia   . Hypertension   . Morbid obesity due to excess calories ([place])   . OA (osteoarthritis)    bilateral hips and knees  . Type 2 diabetes mellitus ([place])    followed by pcp  . Uses wheelchair    due to bilateral hip pain  . Wears glasses     Past Surgical History:  Procedure Laterality Date  . INGUINAL HERNIA REPAIR  2009 approx.   "unsure which side"  . LAPAROSCOPIC CHOLECYSTECTOMY  2005 approx.  Marland Kitchen LAPAROSCOPIC GASTRIC SLEEVE RESECTION N/A 03/17/2018   Procedure: LAPAROSCOPIC GASTRIC SLEEVE RESECTION, UPPER ENDO, ERAS PATHWAY;  Surgeon: Greer Pickerel, MD;  Location: [facility];  Service: General;  Laterality: N/A;    No current facility-administered medications for this encounter.    Current Outpatient Medications  Medication Sig Dispense Refill Last Dose  . atorvastatin (LIPITOR) 20 MG tablet Take 20 mg by mouth at bedtime.    Taking  . calcium-vitamin D (OSCAL WITH D) 500-200 MG-UNIT tablet Take 1 tablet by mouth daily with breakfast.   Taking  . losartan (COZAAR) 50 MG tablet Take 50 mg by mouth daily.   Taking  . Multiple Vitamin (MULTIVITAMIN) tablet Take 1 tablet by mouth  daily.   Taking  . naproxen (NAPROSYN) 500 MG tablet Take 500 mg by mouth daily.   Taking   No Known Allergies   Social History   Tobacco Use  . Smoking status: Never Smoker  . Smokeless tobacco: Never Used  Substance Use Topics  . Alcohol use: Never    Frequency: Never    Family History  Problem Relation Age of Onset  . Cancer Other   . Hypertension Other   . Diabetes Other   . Cancer Father        Liver     Review of Systems  Constitutional: Negative.   HENT: Negative.   Eyes: Negative.   Respiratory: Negative.   Cardiovascular: Negative.   Gastrointestinal: Negative.   Genitourinary: Negative.   Musculoskeletal: Positive for joint pain.  Skin: Negative.   Neurological: Negative.   Endo/Heme/Allergies: Negative.   Psychiatric/Behavioral: Negative.     Objective:  Physical Exam  Constitutional: He is oriented to person, place, and time. He appears well-developed.  HENT:  Head: Normocephalic.  Eyes: Pupils are equal, round, and reactive to light.  Neck: Neck supple. No JVD present. No tracheal deviation present. No thyromegaly present.  Cardiovascular: Normal rate, regular rhythm and intact distal pulses.  Respiratory: Effort normal and breath sounds normal. No respiratory distress. He has no wheezes.  GI: Soft. There is no abdominal tenderness. There is no guarding.  Musculoskeletal:     Left hip: He exhibits decreased range of motion, decreased strength, tenderness and bony tenderness. He exhibits no swelling, no deformity and no laceration.  Lymphadenopathy:    He has no cervical adenopathy.  Neurological: He is alert and oriented to person, place, and time.  Skin: Skin is warm and dry.  Psychiatric: He has a normal mood and affect.    Vital signs in last 24 hours: Weight:  [130.3 kg] 130.3 kg (07/15 0754)  Labs:   Estimated body mass index is 42.41 kg/m as calculated from the following:   Height as of 11/12/18: 5\' 9"  (1.753 m).   Weight as of  11/12/18: 130.3 kg.   Imaging Review Plain radiographs demonstrate severe degenerative joint disease of the left hip. The bone quality appears to be good for age and reported activity level.      Assessment/Plan:  End stage arthritis, left hip  The patient history, physical examination, clinical judgement of the provider and imaging studies are consistent with end stage degenerative joint disease of the left hip and total hip arthroplasty is deemed medically necessary. The treatment options including medical management, injection therapy, arthroscopy and arthroplasty were discussed at length. The risks and benefits of total hip arthroplasty were presented and reviewed. The risks due to aseptic loosening, infection, stiffness, dislocation/subluxation,  thromboembolic complications and other imponderables were discussed.  The patient acknowledged the explanation, agreed to proceed with the plan and consent was signed. Patient is being admitted for inpatient treatment for  surgery, pain control, PT, OT, prophylactic antibiotics, VTE prophylaxis, progressive ambulation and ADL's and discharge planning.The patient is planning to be discharged home.     Anastasio AuerbachMatthew S. Tieler Cournoyer   PA-C  11/12/2018, 3:34 PM

## 2018-11-12 NOTE — Progress Notes (Signed)
Follow-up visit: Post-Operative Sleeve Surgery  Primary concerns today: Post-operative Bariatric Surgery Nutrition Management.  Pt is unfamiliar with how his food is cooked and what it is cooked in due to his wife making his food. Pt states he eats whatever he wants. Pt states he has overeaten a few times.   Pt states his doctor told him to stop taking the extra B12. Pt states he can walk a little bit now which is exciting! Pt states he has both surgery hip replacement coming up the second in september. Pt states he has lost enough for his hip replacement so he is excited about that. Pt states he notices if he skips a meal he gets constipated. Pt states he is going to be forced to do resistance bands after his surgery for recovery.   Dietitian attempted to dig deeper into his diet due to discrepancies but pt maintained what he stated in his 24 hr recall.   Surgery date: 03/17/2018 Surgery type: sleeve Start weight at [facility]: 360.6 Weight today: 287.2 Weight change: +3 pounds  Body Composition Scale 08/13/2018 11/12/2018  Total Body Fat % 39.5 37  Visceral Fat 32 32  Fat-Free Mass % 60.4 62.9   Total Body Water % 41.4 43.9   Muscle-Mass lbs 53.6 54.1  Body Fat Displacement           Torso  lbs 69.7 66         Left Leg  lbs 13.9 13.2         Right Leg  lbs 13.9 13.2         Left Arm  lbs 6.9 6.6         Right Arm   lbs 6.9 6.6    24-hr recall: 4-5 protien shakes thoughout the week B (AM): canadian bacon english muffin with egg and low fat cheese  Snk (AM):  L (PM): steak with roasted brussel sprouts or hamburger steak with onion with turnip greens or black beans or lima beans  Snk (PM):  D (PM): protein shake or 1 egg  Snk (PM):   Fluid intake: 5-6 28 ounce bottles: 140 ounces water also sometimes decaf coffee  Estimated total protein intake: 80+  Medications: see list Supplementation: multi and 2 calcium   CBG monitoring: no longer checking Average CBG per patient:  Last  patient reported A1c:   Using straws: no Drinking while eating: no Having you been chewing well:no Chewing/swallowing difficulties: no Changes in vision: no Changes to mood/headaches: no Hair loss/[place] to skin/Changes to nails: no Any difficulty focusing or concentrating: no Sweating: no Dizziness/Lightheaded: no Palpitations: no  Carbonated beverages: no N/V/D/C/GAS: no Abdominal Pain: no Dumping syndrome: no  Recent physical activity:  ADL's  Progress Towards Goal(s):  In progress.   Nutritional Diagnosis:  [place]-3.3 Overweight/obesity related to past poor dietary habits and physical inactivity as evidenced by patient w/ recent sleeve surgery following dietary guidelines for continued weight loss.    Intervention:  Nutrition counseling. Pts diet was advanced to the next phase now including non starchy veggies. Due to the bodies need for essential vitamins, minerals, and fats the pt was educated on the need to consume a certain amount of calories as well as certain nutrients daily. Pt was educated on the need for daily physical activity and to reach a goal of at least 150 minutes of moderate to vigorous physical activity as directed by their physician due to such benefits as increased musculature and improved lab values.  Due to  getting to be further out from surgery pt was educated on him needing to know where his satisfaction is verses fullness which will be challenging. Educated pt on decreasing his water consumption so he would feel hunger and eat for dinner.  Goals: -Aim to have non starchy vegetables 2 times every day  -Work with your resistance bands for 5 minutes every day -Add a vegetable to your egg like onion or spinach   Teaching Method Utilized:  Visual Auditory Hands on  Barriers to learning/adherence to lifestyle change:   Demonstrated degree of understanding via:  Teach Back   Monitoring/Evaluation:  Dietary intake, exercise, and body weight. Follow up in  october

## 2018-11-21 ENCOUNTER — Other Ambulatory Visit (HOSPITAL_COMMUNITY)
Admission: RE | Admit: 2018-11-21 | Discharge: 2018-11-21 | Disposition: A | Discharge status: Home / Self Care | Payer: Medicare HMO | Source: Ambulatory Visit | Attending: Orthopedic Surgery | Admitting: Orthopedic Surgery

## 2018-11-21 DIAGNOSIS — Z1159 Encounter for screening for other viral diseases: Secondary | ICD-10-CM | POA: Diagnosis present

## 2018-11-21 NOTE — Patient Instructions (Addendum)
YOU HAVE HAD  A COVID 19 TEST, PLEASE BEGIN THE QUARANTINE INSTRUCTIONS AS OUTLINED IN YOUR HANDOUT.                Steven Allen  11/21/2018   Your procedure is scheduled on: 11-25-18    [facility]    Report to Admitting at 6:05 AM   1 VISITOR IS ALLOWED TO WAIT IN WAITING ROOM  ONLY DAY OF YOUR SURGERY.    Call this number if you have problems the morning of surgery [phone]    Remember:  :After Midnight.    CLEAR LIQUIDS UNTIL 3 HOURS PRIOR TO SCHEUDLED SURGERY. PLEASE FINISH ENSURE DRINK PER SURGEON ORDER 3 HOURS PRIOR TO SCHEDULED SURGERY TIME WHICH NEEDS TO BE COMPLETED AT 5:30 AM.   CLEAR LIQUID DIET   Foods Allowed                                                                     Foods Excluded  Coffee and tea, regular and decaf                             liquids that you cannot  Plain Jell-O any favor except red or purple                                           see through such as: Fruit ices (not with fruit pulp)                                     milk, soups, orange juice  Iced Popsicles                                    All solid food Carbonated beverages, regular and diet                                    Cranberry, grape and apple juices Sports drinks like Gatorade Lightly seasoned clear broth or consume(fat free) Sugar, honey syrup  Sample Menu Breakfast                                Lunch                                     Supper Cranberry juice                    Beef broth                            Chicken broth Jell-O  Grape juice                           Apple juice Coffee or tea                        Jell-O                                      Popsicle                                                Coffee or tea                        Coffee or tea  _____________________________________________________________________    Take these medicines the morning of  surgery with A SIP OF WATER: None  BRUSH YOUR TEETH MORNING OF SURGERY AND RINSE YOUR MOUTH OUT, NO CHEWING GUM CANDY OR MINTS.                                   You may not have any metal on your body including hair pins and              piercings     Do not wear jewelry, cologne, lotions, powders or deodorant                       Men may shave face and neck.   Do not bring valuables to the hospital. [name].  Contacts, dentures or bridgework may not be worn into surgery.      Special Instructions: N/A              Please read over the following fact sheets you were given: _____________________________________________________________________             [name] - Preparing for Surgery Before surgery, you can play an important role.  Because skin is not sterile, your skin needs to be as free of germs as possible.  You can reduce the number of germs on your skin by washing with CHG (chlorahexidine gluconate) soap before surgery.  CHG is an antiseptic cleaner which kills germs and bonds with the skin to continue killing germs even after washing. Please DO NOT use if you have an allergy to CHG or antibacterial soaps.  If your skin becomes reddened/irritated stop using the CHG and inform your nurse when you arrive at Short Stay. Do not shave (including legs and underarms) for at least 48 hours prior to the first CHG shower.  You may shave your face/neck. Please follow these instructions carefully:  1.  Shower with CHG Soap the night before surgery and the  morning of Surgery.  2.  If you choose to wash your hair, wash your hair first as usual with your  normal  shampoo.  3.  After you shampoo, rinse your hair and body thoroughly to remove the  shampoo.  4.  Use CHG as you would any other liquid soap.  You can apply chg directly  to the skin and wash                       Gently with a scrungie or clean  washcloth.  5.  Apply the CHG Soap to your body ONLY FROM THE NECK DOWN.   Do not use on face/ open                           Wound or open sores. Avoid contact with eyes, ears mouth and genitals (private parts).                       Wash face,  Genitals (private parts) with your normal soap.             6.  Wash thoroughly, paying special attention to the area where your surgery  will be performed.  7.  Thoroughly rinse your body with warm water from the neck down.  8.  DO NOT shower/wash with your normal soap after using and rinsing off  the CHG Soap.                9.  Pat yourself dry with a clean towel.            10.  Wear clean pajamas.            11.  Place clean sheets on your bed the night of your first shower and do not  sleep with pets. Day of Surgery : Do not apply any lotions/deodorants the morning of surgery.  Please wear clean clothes to the hospital/surgery center.  FAILURE TO FOLLOW THESE INSTRUCTIONS MAY RESULT IN THE CANCELLATION OF YOUR SURGERY PATIENT SIGNATURE_________________________________  NURSE SIGNATURE__________________________________  ________________________________________________________________________   Adam Phenix  An incentive spirometer is a tool that can help keep your lungs clear and active. This tool measures how well you are filling your lungs with each breath. Taking long deep breaths may help reverse or decrease the chance of developing breathing (pulmonary) problems (especially infection) following:  A long period of time when you are unable to move or be active. BEFORE THE PROCEDURE   If the spirometer includes an indicator to show your best effort, your nurse or respiratory therapist will set it to a desired goal.  If possible, sit up straight or lean slightly forward. Try not to slouch.  Hold the incentive spirometer in an upright position. INSTRUCTIONS FOR USE  1. Sit on the edge of your bed if possible, or sit up as far  as you can in bed or on a chair. 2. Hold the incentive spirometer in an upright position. 3. Breathe out normally. 4. Place the mouthpiece in your mouth and seal your lips tightly around it. 5. Breathe in slowly and as deeply as possible, raising the piston or the ball toward the top of the column. 6. Hold your breath for 3-5 seconds or for as long as possible. Allow the piston or ball to fall to the bottom of the column. 7. Remove the mouthpiece from your mouth and breathe out normally. 8. Rest for a few seconds and repeat Steps 1 through 7 at least 10 times every 1-2 hours when you are awake. Take your time and take a few normal breaths between deep breaths. 9. The spirometer may include an indicator to  show your best effort. Use the indicator as a goal to work toward during each repetition. 10. After each set of 10 deep breaths, practice coughing to be sure your lungs are clear. If you have an incision (the cut made at the time of surgery), support your incision when coughing by placing a pillow or rolled up towels firmly against it. Once you are able to get out of bed, walk around indoors and cough well. You may stop using the incentive spirometer when instructed by your caregiver.  RISKS AND COMPLICATIONS  Take your time so you do not get dizzy or light-headed.  If you are in pain, you may need to take or ask for pain medication before doing incentive spirometry. It is harder to take a deep breath if you are having pain. AFTER USE  Rest and breathe slowly and easily.  It can be helpful to keep track of a log of your progress. Your caregiver can provide you with a simple table to help with this. If you are using the spirometer at home, follow these instructions: [place] IF:   You are having difficultly using the spirometer.  You have trouble using the spirometer as often as instructed.  Your pain medication is not giving enough relief while using the spirometer.  You  develop fever of 100.5 F (38.1 C) or higher. SEEK IMMEDIATE MEDICAL CARE IF:   You cough up bloody sputum that had not been present before.  You develop fever of 102 F (38.9 C) or greater.  You develop worsening pain at or near the incision site. MAKE SURE YOU:   Understand these instructions.  Will watch your condition.  Will get help right away if you are not doing well or get worse. Document Released: 08/27/2006 Document Revised: 07/09/2011 Document Reviewed: 10/28/2006 ExitCare Patient Information 2014 ExitCare, [place].   ________________________________________________________________________  WHAT IS A BLOOD TRANSFUSION? Blood Transfusion Information  A transfusion is the replacement of blood or some of its parts. Blood is made up of multiple cells which provide different functions.  Red blood cells carry oxygen and are used for blood loss replacement.  White blood cells fight against infection.  Platelets control bleeding.  Plasma helps clot blood.  Other blood products are available for specialized needs, such as hemophilia or other clotting disorders. BEFORE THE TRANSFUSION  Who gives blood for transfusions?   Healthy volunteers who are fully evaluated to make sure their blood is safe. This is blood bank blood. Transfusion therapy is the safest it has ever been in the practice of medicine. Before blood is taken from a donor, a complete history is taken to make sure that person has no history of diseases nor engages in risky social behavior (examples are intravenous drug use or sexual activity with multiple partners). The donor's travel history is screened to minimize risk of transmitting infections, such as malaria. The donated blood is tested for signs of infectious diseases, such as HIV and hepatitis. The blood is then tested to be sure it is compatible with you in order to minimize the chance of a transfusion reaction. If you or a relative donates blood, this is  often done in anticipation of surgery and is not appropriate for emergency situations. It takes many days to process the donated blood. RISKS AND COMPLICATIONS Although transfusion therapy is very safe and saves many lives, the main dangers of transfusion include:   Getting an infectious disease.  Developing a transfusion reaction. This is an allergic reaction  to something in the blood you were given. Every precaution is taken to prevent this. The decision to have a blood transfusion has been considered carefully by your caregiver before blood is given. Blood is not given unless the benefits outweigh the risks. AFTER THE TRANSFUSION  Right after receiving a blood transfusion, you will usually feel much better and more energetic. This is especially true if your red blood cells have gotten low (anemic). The transfusion raises the level of the red blood cells which carry oxygen, and this usually causes an energy increase.  The nurse administering the transfusion will monitor you carefully for complications. HOME CARE INSTRUCTIONS  No special instructions are needed after a transfusion. You may find your energy is better. Speak with your caregiver about any limitations on activity for underlying diseases you may have. SEEK MEDICAL CARE IF:   Your condition is not improving after your transfusion.  You develop redness or irritation at the intravenous (IV) site. SEEK IMMEDIATE MEDICAL CARE IF:  Any of the following symptoms occur over the next 12 hours:  Shaking chills.  You have a temperature by mouth above 102 F (38.9 C), not controlled by medicine.  Chest, back, or muscle pain.  People around you feel you are not acting correctly or are confused.  Shortness of breath or difficulty breathing.  Dizziness and fainting.  You get a rash or develop hives.  You have a decrease in urine output.  Your urine turns a dark color or changes to pink, red, or brown. Any of the following  symptoms occur over the next 10 days:  You have a temperature by mouth above 102 F (38.9 C), not controlled by medicine.  Shortness of breath.  Weakness after normal activity.  The white part of the eye turns yellow (jaundice).  You have a decrease in the amount of urine or are urinating less often.  Your urine turns a dark color or changes to pink, red, or brown. Document Released: 04/13/2000 Document Revised: 07/09/2011 Document Reviewed: 12/01/2007 [facility] Patient Information 2014 [place], [place].  _______________________________________________________________________

## 2018-11-21 NOTE — Progress Notes (Signed)
10-29-18 (Epic) Cardiac clearance from Dr. Saunders Revel, along with an EKG  11-21-17 (Epic) Stress Test

## 2018-11-22 LAB — SARS CORONAVIRUS 2 (TAT 6-24 HRS): SARS Coronavirus 2: NEGATIVE

## 2018-11-24 ENCOUNTER — Other Ambulatory Visit: Payer: Self-pay

## 2018-11-24 ENCOUNTER — Encounter (HOSPITAL_COMMUNITY)
Admission: RE | Admit: 2018-11-24 | Discharge: 2018-11-24 | Disposition: A | Discharge status: Home / Self Care | Payer: Medicare HMO | Source: Ambulatory Visit | Attending: Orthopedic Surgery | Admitting: Orthopedic Surgery

## 2018-11-24 ENCOUNTER — Encounter (HOSPITAL_COMMUNITY): Payer: Self-pay

## 2018-11-24 DIAGNOSIS — Z01812 Encounter for preprocedural laboratory examination: Secondary | ICD-10-CM | POA: Insufficient documentation

## 2018-11-24 DIAGNOSIS — M1612 Unilateral primary osteoarthritis, left hip: Secondary | ICD-10-CM | POA: Insufficient documentation

## 2018-11-24 DIAGNOSIS — I10 Essential (primary) hypertension: Secondary | ICD-10-CM | POA: Insufficient documentation

## 2018-11-24 DIAGNOSIS — E7800 Pure hypercholesterolemia, unspecified: Secondary | ICD-10-CM | POA: Insufficient documentation

## 2018-11-24 DIAGNOSIS — I2510 Atherosclerotic heart disease of native coronary artery without angina pectoris: Secondary | ICD-10-CM | POA: Insufficient documentation

## 2018-11-24 DIAGNOSIS — E119 Type 2 diabetes mellitus without complications: Secondary | ICD-10-CM | POA: Insufficient documentation

## 2018-11-24 DIAGNOSIS — Z79899 Other long term (current) drug therapy: Secondary | ICD-10-CM | POA: Insufficient documentation

## 2018-11-24 LAB — CBC
HCT: 48.7 % (ref 39.0–52.0)
Hemoglobin: 15.4 g/dL (ref 13.0–17.0)
MCH: 29.8 pg (ref 26.0–34.0)
MCHC: 31.6 g/dL (ref 30.0–36.0)
MCV: 94.2 fL (ref 80.0–100.0)
Platelets: 154 10*3/uL (ref 150–400)
RBC: 5.17 MIL/uL (ref 4.22–5.81)
RDW: 13.2 % (ref 11.5–15.5)
WBC: 8.4 10*3/uL (ref 4.0–10.5)
nRBC: 0 % (ref 0.0–0.2)

## 2018-11-24 LAB — TYPE AND SCREEN
ABO/RH(D): B POS
Antibody Screen: NEGATIVE

## 2018-11-24 LAB — BASIC METABOLIC PANEL
Anion gap: 11 (ref 5–15)
BUN: 24 mg/dL — ABNORMAL HIGH (ref 6–20)
CO2: 23 mmol/L (ref 22–32)
Calcium: 9.3 mg/dL (ref 8.9–10.3)
Chloride: 105 mmol/L (ref 98–111)
Creatinine, Ser: 0.68 mg/dL (ref 0.61–1.24)
GFR calc Af Amer: >60 mL/min (ref >60)
GFR calc non Af Amer: >60 mL/min (ref >60)
Glucose, Bld: 107 mg/dL — ABNORMAL HIGH (ref 70–99)
Potassium: 3.9 mmol/L (ref 3.5–5.1)
Sodium: 139 mmol/L (ref 135–145)

## 2018-11-24 LAB — HEMOGLOBIN A1C
Hgb A1c MFr Bld: 5.4 % (ref 4.8–5.6)
Mean Plasma Glucose: 108.28 mg/dL

## 2018-11-24 LAB — SURGICAL PCR SCREEN
MRSA, PCR: NEGATIVE
Staphylococcus aureus: NEGATIVE

## 2018-11-24 LAB — GLUCOSE, CAPILLARY: Glucose-Capillary: 94 mg/dL (ref 70–99)

## 2018-11-24 MED ORDER — DEXTROSE 5 % IV SOLN
3.0000 g | INTRAVENOUS | Status: AC
  Administered 2018-11-25: 3 g via INTRAVENOUS
  Filled 2018-11-24: qty 3

## 2018-11-24 NOTE — Anesthesia Preprocedure Evaluation (Addendum)
Anesthesia Evaluation  Patient identified by MRN, date of birth, ID band Patient awake    Reviewed: Allergy & Precautions, NPO status , Patient's Chart, lab work & pertinent test results  Airway Mallampati: II  TM Distance: >3 FB Neck ROM: Full    Dental   Pulmonary neg pulmonary ROS,    breath sounds clear to auscultation       Cardiovascular hypertension, Pt. on medications + CAD   Rhythm:Regular Rate:Normal     Neuro/Psych negative neurological ROS     GI/Hepatic negative GI ROS, Neg liver ROS,   Endo/Other  diabetesMorbid obesity  Renal/GU negative Renal ROS     Musculoskeletal  (+) Arthritis ,   Abdominal   Peds  Hematology negative hematology ROS (+)   Anesthesia Other Findings   Reproductive/Obstetrics                           Lab Results  Component Value Date   WBC 8.4 11/24/2018   HGB 15.4 11/24/2018   HCT 48.7 11/24/2018   MCV 94.2 11/24/2018   PLT 154 11/24/2018   Lab Results  Component Value Date   CREATININE 0.68 11/24/2018   BUN 24 (H) 11/24/2018   NA 139 11/24/2018   K 3.9 11/24/2018   CL 105 11/24/2018   CO2 23 11/24/2018    Anesthesia Physical Anesthesia Plan  ASA: III  Anesthesia Plan: Spinal   Post-op Pain Management:    Induction:   PONV Risk Score and Plan: 1 and Propofol infusion, Ondansetron and Treatment may vary due to age or medical condition  Airway Management Planned: Natural Airway and Simple Face Mask  Additional Equipment:   Intra-op Plan:   Post-operative Plan:   Informed Consent: I have reviewed the patients History and Physical, chart, labs and discussed the procedure including the risks, benefits and alternatives for the proposed anesthesia with the patient or authorized representative who has indicated his/her understanding and acceptance.       Plan Discussed with: CRNA  Anesthesia Plan Comments:       Anesthesia  Quick Evaluation

## 2018-11-24 NOTE — Progress Notes (Signed)
Anesthesia Chart Review   Case: [id number] Date/Time: 11/25/18 0824   Procedure: TOTAL HIP ARTHROPLASTY ANTERIOR APPROACH (Left ) - 70 mins   Anesthesia type: Spinal   Pre-op diagnosis: Left hip osteoarthritis   Location: [facility] ROOM 10 / [facility]   Surgeon: Durene Romanslin, Matthew, MD      DISCUSSION:58 y.o. never smoker with h/o HTN, nonobstructive CAD, HLD, DM II, s/p bariatric surgery, left hip OA scheduled for above procedure 11/25/2018 with Dr. Durene RomansMatthew Olin.   Pt cleared by cardiology 10/30/2018.  Per Corine ShelterLuke Kilroy, PA-C, "Given past medical history and time since last visit, based on ACC/AHA guidelines, Steven SofiaFranklin Allen would be at acceptable risk for the planned procedure without further cardiovascular testing.  OK to hold aspirin if needed pre op, resume when safe post op."  Anticipate pt can proceed with planned procedure barring acute status change.   VS: BP 123/76 (BP Location: Right Arm)   Pulse 72   Temp 36.9 C (Oral)   Resp 18   Ht 5\' 10"  (1.778 m)   Wt 135.3 kg   SpO2 98%   BMI 42.79 kg/m   PROVIDERS: Alden HippEverhart, [place], PA is PCP   End, Cristal Deerhristopher, MD is Cardiologist last seen 10/29/2018 LABS: Labs reviewed: Acceptable for surgery. (all labs ordered are listed, but only abnormal results are displayed)  Labs Reviewed  GLUCOSE, CAPILLARY     IMAGES:   EKG: 10/29/2018 Rate 66 bpm Normal sinus rhythm  Low voltage QRS Inferior infarct, age undetermined Possible Anterolateral infarct, age undetermined   CV: CT Coronary 01/15/2018 IMPRESSION: Borderline FFR CT in distal LAD and RCA suggests medical Rx Appropriate  Myocardial Perfusion Imaging 11/22/2017  Nuclear stress EF: 53%.  No T wave inversion was noted during stress.  There was no ST segment deviation noted during stress.  Defect 1: There is a large defect of moderate severity.  Findings consistent with prior myocardial infarction.  This is an intermediate risk study.   Large size, moderate intensity  mostly-fixed apical, apical septal/anterior/lateral perfusion defect, suggestive of scar with minimal peri-infarct ischemia. LVEF 53% with apical hypokinesis. This is an intermediate risk study. Past Medical History:  Diagnosis Date  . Body mass index (BMI) 45.0-49.9, adult (HCC)   . Chronic hip pain   . Coronary artery disease    cardiology -- dr end-- nuclear stress test 11-22-2017 showed abnormal with intermediate risk , recommended coronary CTA and if no obstruction pt cleared for surgery (per final result non-obstructve cad in distal LAD and RCA  . Decreased strength of upper extremity   . Exercise counseling   . Full dentures   . Hyperlipidemia   . Hypertension   . Morbid obesity due to excess calories (HCC)   . OA (osteoarthritis)    bilateral hips and knees  . Type 2 diabetes mellitus (HCC)    followed by pcp  . Uses wheelchair    due to bilateral hip pain  . Wears glasses     Past Surgical History:  Procedure Laterality Date  . INGUINAL HERNIA REPAIR  2009 approx.   "unsure which side"  . LAPAROSCOPIC CHOLECYSTECTOMY  2005 approx.  Marland Kitchen. LAPAROSCOPIC GASTRIC SLEEVE RESECTION N/A 03/17/2018   Procedure: LAPAROSCOPIC GASTRIC SLEEVE RESECTION, UPPER ENDO, ERAS PATHWAY;  Surgeon: Gaynelle AduWilson, Eric, MD;  Location: [facility];  Service: General;  Laterality: N/A;    MEDICATIONS: . methocarbamol (ROBAXIN) 750 MG tablet  . atorvastatin (LIPITOR) 20 MG tablet  . Calcium Carbonate-Vitamin D 600-400 MG-UNIT tablet  . losartan (COZAAR)  50 MG tablet  . Multiple Vitamins-Minerals (BARIATRIC MULTIVITAMINS/IRON PO)  . naproxen (NAPROSYN) 500 MG tablet   No current facility-administered medications for this encounter.    Derrill Memo ON 11/25/2018] ceFAZolin (ANCEF) 3 g in dextrose 5 % 50 mL IVPB     Maia Plan [facility] Pre-Surgical Testing [phone] 11/24/18  11:07 AM

## 2018-11-25 ENCOUNTER — Inpatient Hospital Stay (HOSPITAL_COMMUNITY): Payer: Medicare HMO | Admitting: Certified Registered"

## 2018-11-25 ENCOUNTER — Inpatient Hospital Stay (HOSPITAL_COMMUNITY): Payer: Medicare HMO

## 2018-11-25 ENCOUNTER — Observation Stay (HOSPITAL_COMMUNITY): Payer: Medicare HMO

## 2018-11-25 ENCOUNTER — Encounter (HOSPITAL_COMMUNITY): Payer: Self-pay | Admitting: *Deleted

## 2018-11-25 ENCOUNTER — Inpatient Hospital Stay (HOSPITAL_COMMUNITY): Payer: Medicare HMO | Admitting: Physician Assistant

## 2018-11-25 ENCOUNTER — Inpatient Hospital Stay (HOSPITAL_COMMUNITY)
Admission: RE | Admit: 2018-11-25 | Discharge: 2018-11-26 | DRG: 470 | Disposition: A | Discharge status: Home / Self Care | Payer: Medicare HMO | Attending: Orthopedic Surgery | Admitting: Orthopedic Surgery

## 2018-11-25 ENCOUNTER — Encounter (HOSPITAL_COMMUNITY)
Admission: RE | Disposition: A | Discharge status: Home / Self Care | Payer: Self-pay | Source: Home / Self Care | Attending: Orthopedic Surgery

## 2018-11-25 DIAGNOSIS — M25752 Osteophyte, left hip: Secondary | ICD-10-CM | POA: Diagnosis present

## 2018-11-25 DIAGNOSIS — M1612 Unilateral primary osteoarthritis, left hip: Secondary | ICD-10-CM | POA: Diagnosis present

## 2018-11-25 DIAGNOSIS — E785 Hyperlipidemia, unspecified: Secondary | ICD-10-CM | POA: Diagnosis present

## 2018-11-25 DIAGNOSIS — Z419 Encounter for procedure for purposes other than remedying health state, unspecified: Secondary | ICD-10-CM

## 2018-11-25 DIAGNOSIS — I10 Essential (primary) hypertension: Secondary | ICD-10-CM | POA: Diagnosis present

## 2018-11-25 DIAGNOSIS — Z6841 Body Mass Index (BMI) 40.0 and over, adult: Secondary | ICD-10-CM

## 2018-11-25 DIAGNOSIS — Z96642 Presence of left artificial hip joint: Secondary | ICD-10-CM

## 2018-11-25 DIAGNOSIS — Z96649 Presence of unspecified artificial hip joint: Secondary | ICD-10-CM

## 2018-11-25 HISTORY — PX: TOTAL HIP ARTHROPLASTY: SHX124

## 2018-11-25 LAB — GLUCOSE, CAPILLARY: Glucose-Capillary: 122 mg/dL — ABNORMAL HIGH (ref 70–99)

## 2018-11-25 SURGERY — ARTHROPLASTY, HIP, TOTAL, ANTERIOR APPROACH
Anesthesia: Spinal | Site: Hip | Laterality: Left

## 2018-11-25 MED ORDER — ONDANSETRON HCL 4 MG/2ML IJ SOLN
INTRAMUSCULAR | Status: AC
  Filled 2018-11-25: qty 2

## 2018-11-25 MED ORDER — PROPOFOL 10 MG/ML IV BOLUS
INTRAVENOUS | Status: AC
  Filled 2018-11-25: qty 60

## 2018-11-25 MED ORDER — PHENOL 1.4 % MT LIQD: 1.0000 | OROMUCOSAL | Status: DC | PRN

## 2018-11-25 MED ORDER — HYDROCODONE-ACETAMINOPHEN 5-325 MG PO TABS
1.0000 | ORAL_TABLET | ORAL | Status: DC | PRN
  Administered 2018-11-25: 1 via ORAL
  Filled 2018-11-25: qty 1

## 2018-11-25 MED ORDER — EPHEDRINE SULFATE-NACL 50-0.9 MG/10ML-% IV SOSY
PREFILLED_SYRINGE | INTRAVENOUS | Status: DC | PRN
  Administered 2018-11-25 (×4): 5 mg via INTRAVENOUS
  Administered 2018-11-25: 10 mg via INTRAVENOUS
  Administered 2018-11-25 (×2): 5 mg via INTRAVENOUS

## 2018-11-25 MED ORDER — PROPOFOL 500 MG/50ML IV EMUL
INTRAVENOUS | Status: DC | PRN
  Administered 2018-11-25: 50 ug/kg/min via INTRAVENOUS

## 2018-11-25 MED ORDER — DIPHENHYDRAMINE HCL 12.5 MG/5ML PO ELIX: 12.5000 mg | ORAL_SOLUTION | ORAL | Status: DC | PRN

## 2018-11-25 MED ORDER — ALBUMIN HUMAN 5 % IV SOLN
INTRAVENOUS | Status: AC
  Filled 2018-11-25: qty 500

## 2018-11-25 MED ORDER — LIDOCAINE 2% (20 MG/ML) 5 ML SYRINGE
INTRAMUSCULAR | Status: DC | PRN
  Administered 2018-11-25: 60 mg via INTRAVENOUS

## 2018-11-25 MED ORDER — CEFAZOLIN SODIUM-DEXTROSE 2-4 GM/100ML-% IV SOLN
2.0000 g | Freq: Four times a day (QID) | INTRAVENOUS | Status: AC
  Administered 2018-11-25 (×2): 2 g via INTRAVENOUS
  Filled 2018-11-25 (×2): qty 100

## 2018-11-25 MED ORDER — LIDOCAINE 2% (20 MG/ML) 5 ML SYRINGE
INTRAMUSCULAR | Status: AC
  Filled 2018-11-25: qty 5

## 2018-11-25 MED ORDER — METHOCARBAMOL 500 MG IVPB - SIMPLE MED
INTRAVENOUS | Status: AC
  Filled 2018-11-25: qty 50

## 2018-11-25 MED ORDER — TRANEXAMIC ACID-NACL 1000-0.7 MG/100ML-% IV SOLN
1000.0000 mg | INTRAVENOUS | Status: AC
  Administered 2018-11-25: 09:00:00 1000 mg via INTRAVENOUS
  Filled 2018-11-25: qty 100

## 2018-11-25 MED ORDER — ACETAMINOPHEN 325 MG PO TABS: 325.0000 mg | ORAL_TABLET | Freq: Four times a day (QID) | ORAL | Status: DC | PRN

## 2018-11-25 MED ORDER — HYDROMORPHONE HCL 1 MG/ML IJ SOLN: 0.5000 mg | INTRAMUSCULAR | Status: DC | PRN

## 2018-11-25 MED ORDER — HYDROCODONE-ACETAMINOPHEN 7.5-325 MG PO TABS
1.0000 | ORAL_TABLET | ORAL | Status: DC | PRN
  Administered 2018-11-25 – 2018-11-26 (×4): 1 via ORAL
  Filled 2018-11-25: qty 2
  Filled 2018-11-25 (×3): qty 1

## 2018-11-25 MED ORDER — DOCUSATE SODIUM 100 MG PO CAPS
100.0000 mg | ORAL_CAPSULE | Freq: Two times a day (BID) | ORAL | Status: DC
  Administered 2018-11-25 – 2018-11-26 (×3): 100 mg via ORAL
  Filled 2018-11-25 (×3): qty 1

## 2018-11-25 MED ORDER — BISACODYL 10 MG RE SUPP: 10.0000 mg | Freq: Every day | RECTAL | Status: DC | PRN

## 2018-11-25 MED ORDER — PHENYLEPHRINE 40 MCG/ML (10ML) SYRINGE FOR IV PUSH (FOR BLOOD PRESSURE SUPPORT)
PREFILLED_SYRINGE | INTRAVENOUS | Status: AC
  Filled 2018-11-25: qty 10

## 2018-11-25 MED ORDER — METHOCARBAMOL 500 MG IVPB - SIMPLE MED
500.0000 mg | Freq: Four times a day (QID) | INTRAVENOUS | Status: DC | PRN
  Administered 2018-11-25: 500 mg via INTRAVENOUS
  Filled 2018-11-25: qty 50

## 2018-11-25 MED ORDER — SODIUM CHLORIDE 0.9 % IV SOLN
INTRAVENOUS | Status: DC
  Administered 2018-11-25: 16:00:00 via INTRAVENOUS

## 2018-11-25 MED ORDER — POLYETHYLENE GLYCOL 3350 17 G PO PACK
17.0000 g | PACK | Freq: Two times a day (BID) | ORAL | Status: DC
  Administered 2018-11-26: 17 g via ORAL
  Filled 2018-11-25 (×2): qty 1

## 2018-11-25 MED ORDER — MENTHOL 3 MG MT LOZG: 1.0000 | LOZENGE | OROMUCOSAL | Status: DC | PRN

## 2018-11-25 MED ORDER — CHLORHEXIDINE GLUCONATE 4 % EX LIQD: 60.0000 mL | Freq: Once | CUTANEOUS | Status: DC

## 2018-11-25 MED ORDER — PROPOFOL 10 MG/ML IV BOLUS
INTRAVENOUS | Status: AC
  Filled 2018-11-25: qty 20

## 2018-11-25 MED ORDER — POVIDONE-IODINE 10 % EX SWAB
2.0000 "application " | Freq: Once | CUTANEOUS | Status: AC
  Administered 2018-11-25: 2 via TOPICAL

## 2018-11-25 MED ORDER — MIDAZOLAM HCL 2 MG/2ML IJ SOLN
INTRAMUSCULAR | Status: DC | PRN
  Administered 2018-11-25 (×2): 1 mg via INTRAVENOUS

## 2018-11-25 MED ORDER — ONDANSETRON HCL 4 MG/2ML IJ SOLN: 4.0000 mg | Freq: Four times a day (QID) | INTRAMUSCULAR | Status: DC | PRN

## 2018-11-25 MED ORDER — PROPOFOL 10 MG/ML IV BOLUS
INTRAVENOUS | Status: AC
  Filled 2018-11-25: qty 40

## 2018-11-25 MED ORDER — FENTANYL CITRATE (PF) 100 MCG/2ML IJ SOLN: 25.0000 ug | INTRAMUSCULAR | Status: DC | PRN

## 2018-11-25 MED ORDER — LACTATED RINGERS IV SOLN
INTRAVENOUS | Status: DC
  Administered 2018-11-25: 07:00:00 via INTRAVENOUS

## 2018-11-25 MED ORDER — TRANEXAMIC ACID-NACL 1000-0.7 MG/100ML-% IV SOLN
1000.0000 mg | Freq: Once | INTRAVENOUS | Status: AC
  Administered 2018-11-25: 1000 mg via INTRAVENOUS
  Filled 2018-11-25: qty 100

## 2018-11-25 MED ORDER — DEXAMETHASONE SODIUM PHOSPHATE 10 MG/ML IJ SOLN
10.0000 mg | Freq: Once | INTRAMUSCULAR | Status: AC
  Administered 2018-11-25: 10 mg via INTRAVENOUS

## 2018-11-25 MED ORDER — MIDAZOLAM HCL 2 MG/2ML IJ SOLN
INTRAMUSCULAR | Status: AC
  Filled 2018-11-25: qty 2

## 2018-11-25 MED ORDER — SODIUM CHLORIDE 0.9 % IR SOLN
Status: DC | PRN
  Administered 2018-11-25: 1000 mL

## 2018-11-25 MED ORDER — FENTANYL CITRATE (PF) 100 MCG/2ML IJ SOLN
INTRAMUSCULAR | Status: AC
  Filled 2018-11-25: qty 2

## 2018-11-25 MED ORDER — ALUM & MAG HYDROXIDE-SIMETH 200-200-20 MG/5ML PO SUSP: 15.0000 mL | ORAL | Status: DC | PRN

## 2018-11-25 MED ORDER — ONDANSETRON HCL 4 MG/2ML IJ SOLN
INTRAMUSCULAR | Status: DC | PRN
  Administered 2018-11-25: 4 mg via INTRAVENOUS

## 2018-11-25 MED ORDER — DEXAMETHASONE SODIUM PHOSPHATE 10 MG/ML IJ SOLN
10.0000 mg | Freq: Once | INTRAMUSCULAR | Status: AC
  Administered 2018-11-26: 10 mg via INTRAVENOUS
  Filled 2018-11-25: qty 1

## 2018-11-25 MED ORDER — PHENYLEPHRINE 40 MCG/ML (10ML) SYRINGE FOR IV PUSH (FOR BLOOD PRESSURE SUPPORT)
PREFILLED_SYRINGE | INTRAVENOUS | Status: DC | PRN
  Administered 2018-11-25 (×2): 80 ug via INTRAVENOUS
  Administered 2018-11-25: 40 ug via INTRAVENOUS
  Administered 2018-11-25: 80 ug via INTRAVENOUS
  Administered 2018-11-25 (×2): 40 ug via INTRAVENOUS

## 2018-11-25 MED ORDER — MAGNESIUM CITRATE PO SOLN: 1.0000 | Freq: Once | ORAL | Status: DC | PRN

## 2018-11-25 MED ORDER — ALBUMIN HUMAN 5 % IV SOLN
INTRAVENOUS | Status: DC | PRN
  Administered 2018-11-25: 10:00:00 via INTRAVENOUS

## 2018-11-25 MED ORDER — METOCLOPRAMIDE HCL 5 MG PO TABS: 5.0000 mg | ORAL_TABLET | Freq: Three times a day (TID) | ORAL | Status: DC | PRN

## 2018-11-25 MED ORDER — FENTANYL CITRATE (PF) 100 MCG/2ML IJ SOLN
INTRAMUSCULAR | Status: DC | PRN
  Administered 2018-11-25 (×2): 25 ug via INTRAVENOUS

## 2018-11-25 MED ORDER — ATORVASTATIN CALCIUM 20 MG PO TABS
20.0000 mg | ORAL_TABLET | Freq: Every day | ORAL | Status: DC
  Administered 2018-11-25: 20 mg via ORAL
  Filled 2018-11-25: qty 1

## 2018-11-25 MED ORDER — EPHEDRINE 5 MG/ML INJ
INTRAVENOUS | Status: AC
  Filled 2018-11-25: qty 10

## 2018-11-25 MED ORDER — BUPIVACAINE IN DEXTROSE 0.75-8.25 % IT SOLN
INTRATHECAL | Status: DC | PRN
  Administered 2018-11-25: 2 mL via INTRATHECAL

## 2018-11-25 MED ORDER — DEXAMETHASONE SODIUM PHOSPHATE 10 MG/ML IJ SOLN
INTRAMUSCULAR | Status: AC
  Filled 2018-11-25: qty 1

## 2018-11-25 MED ORDER — ONDANSETRON HCL 4 MG/2ML IJ SOLN: 4.0000 mg | Freq: Once | INTRAMUSCULAR | Status: DC | PRN

## 2018-11-25 MED ORDER — FERROUS SULFATE 325 (65 FE) MG PO TABS
325.0000 mg | ORAL_TABLET | Freq: Three times a day (TID) | ORAL | Status: DC
  Administered 2018-11-25 – 2018-11-26 (×3): 325 mg via ORAL
  Filled 2018-11-25 (×3): qty 1

## 2018-11-25 MED ORDER — LOSARTAN POTASSIUM 50 MG PO TABS
50.0000 mg | ORAL_TABLET | Freq: Every day | ORAL | Status: DC
  Filled 2018-11-25 (×2): qty 1

## 2018-11-25 MED ORDER — METOCLOPRAMIDE HCL 5 MG/ML IJ SOLN: 5.0000 mg | Freq: Three times a day (TID) | INTRAMUSCULAR | Status: DC | PRN

## 2018-11-25 MED ORDER — SODIUM CHLORIDE 0.9 % IV SOLN
INTRAVENOUS | Status: DC | PRN
  Administered 2018-11-25: 30 ug/min via INTRAVENOUS

## 2018-11-25 MED ORDER — ONDANSETRON HCL 4 MG PO TABS: 4.0000 mg | ORAL_TABLET | Freq: Four times a day (QID) | ORAL | Status: DC | PRN

## 2018-11-25 MED ORDER — METHOCARBAMOL 500 MG PO TABS
500.0000 mg | ORAL_TABLET | Freq: Four times a day (QID) | ORAL | Status: DC | PRN
  Administered 2018-11-26: 500 mg via ORAL
  Filled 2018-11-25: qty 1

## 2018-11-25 MED ORDER — ASPIRIN 81 MG PO CHEW
81.0000 mg | CHEWABLE_TABLET | Freq: Two times a day (BID) | ORAL | Status: DC
  Administered 2018-11-25 – 2018-11-26 (×2): 81 mg via ORAL
  Filled 2018-11-25 (×2): qty 1

## 2018-11-25 MED ORDER — CELECOXIB 200 MG PO CAPS
200.0000 mg | ORAL_CAPSULE | Freq: Two times a day (BID) | ORAL | Status: DC
  Administered 2018-11-25 – 2018-11-26 (×2): 200 mg via ORAL
  Filled 2018-11-25 (×2): qty 1

## 2018-11-25 SURGICAL SUPPLY — 41 items
BAG ZIPLOCK 12X15 (MISCELLANEOUS) IMPLANT
BLADE SAG 18X100X1.27 (BLADE) ×2 IMPLANT
COVER PERINEAL POST (MISCELLANEOUS) ×2 IMPLANT
COVER SURGICAL LIGHT HANDLE (MISCELLANEOUS) ×2 IMPLANT
COVER WAND RF STERILE (DRAPES) IMPLANT
CUP ACET PINNACLE SECTR 58MM (Hips) IMPLANT
DERMABOND ADVANCED (GAUZE/BANDAGES/DRESSINGS) ×1
DERMABOND ADVANCED .7 DNX12 (GAUZE/BANDAGES/DRESSINGS) ×1 IMPLANT
DRAPE STERI IOBAN 125X83 (DRAPES) ×2 IMPLANT
DRAPE U-SHAPE 47X51 STRL (DRAPES) ×4 IMPLANT
DRESSING AQUACEL AG SP 3.5X10 (GAUZE/BANDAGES/DRESSINGS) ×1 IMPLANT
DRSG AQUACEL AG ADV 3.5X10 (GAUZE/BANDAGES/DRESSINGS) ×1 IMPLANT
DRSG AQUACEL AG SP 3.5X10 (GAUZE/BANDAGES/DRESSINGS) ×2
DURAPREP 26ML APPLICATOR (WOUND CARE) ×2 IMPLANT
ELECT REM PT RETURN 15FT ADLT (MISCELLANEOUS) ×2 IMPLANT
ELIMINATOR HOLE APEX DEPUY (Hips) ×1 IMPLANT
GLOVE BIOGEL PI IND STRL 7.5 (GLOVE) ×1 IMPLANT
GLOVE BIOGEL PI IND STRL 8.5 (GLOVE) ×1 IMPLANT
GLOVE BIOGEL PI INDICATOR 7.5 (GLOVE) ×1
GLOVE BIOGEL PI INDICATOR 8.5 (GLOVE) ×1
GLOVE ECLIPSE 8.0 STRL XLNG CF (GLOVE) ×4 IMPLANT
GLOVE ORTHO TXT STRL SZ7.5 (GLOVE) ×3 IMPLANT
GOWN STRL REUS W/TWL LRG LVL3 (GOWN DISPOSABLE) ×4 IMPLANT
GOWN STRL REUS W/TWL XL LVL3 (GOWN DISPOSABLE) ×2 IMPLANT
HEAD CERAMIC 36 PLUS 8.5 12 14 (Hips) ×1 IMPLANT
HOLDER FOLEY CATH W/STRAP (MISCELLANEOUS) ×2 IMPLANT
KIT TURNOVER KIT A (KITS) IMPLANT
LINER NEUTRAL 36X58 PLUS4 ×1 IMPLANT
PACK ANTERIOR HIP CUSTOM (KITS) ×2 IMPLANT
PINNACLE SECTOR CUP 58MM (Hips) ×2 IMPLANT
SCREW 6.5MMX30MM (Screw) ×1 IMPLANT
STEM FEM ACTIS HIGH SZ8 (Stem) ×1 IMPLANT
SUT MNCRL AB 4-0 PS2 18 (SUTURE) ×2 IMPLANT
SUT STRATAFIX 0 PDS 27 VIOLET (SUTURE) ×2
SUT VIC AB 1 CT1 36 (SUTURE) ×6 IMPLANT
SUT VIC AB 2-0 CT1 27 (SUTURE) ×2
SUT VIC AB 2-0 CT1 TAPERPNT 27 (SUTURE) ×2 IMPLANT
SUTURE STRATFX 0 PDS 27 VIOLET (SUTURE) ×1 IMPLANT
TRAY FOLEY MTR SLVR 16FR STAT (SET/KITS/TRAYS/PACK) ×1 IMPLANT
WATER STERILE IRR 1000ML POUR (IV SOLUTION) ×2 IMPLANT
YANKAUER SUCT BULB TIP 10FT TU (MISCELLANEOUS) ×1 IMPLANT

## 2018-11-25 NOTE — Op Note (Signed)
NAME:  Steven Allen                ACCOUNT NO.: [id number]      MEDICAL RECORD NO.: [id number]      FACILITY:  [facility]      PHYSICIAN:  Mauri Pole  DATE OF BIRTH:  1960-11-13     DATE OF PROCEDURE:  11/25/2018                                 OPERATIVE REPORT         PREOPERATIVE DIAGNOSIS: Left  hip osteoarthritis.      POSTOPERATIVE DIAGNOSIS:  Left hip osteoarthritis with significant peri-acetabular osteophytes.      PROCEDURE:  Left total hip replacement through an anterior approach   utilizing DePuy THR system, component size 32mm pinnacle cup, a size 36+4 neutral   Altrex liner, a size 8 Hi Actis stem with a 36+8.5 delta ceramic   ball.      SURGEON:  Pietro Cassis. Alvan Dame, M.D.      ASSISTANT:  Danae Orleans, PA-C     ANESTHESIA:  Spinal.      SPECIMENS:  None.      COMPLICATIONS:  None.      BLOOD LOSS:  1500 cc     DRAINS:  None.      INDICATION OF THE PROCEDURE:  Steven Allen is a 58 y.o. male who had   presented to office for evaluation of left hip pain.  Radiographs revealed   progressive degenerative changes with bone-on-bone   articulation of the  hip joint, including subchondral cystic changes and osteophytes.  The patient had painful limited range of   motion significantly affecting their overall quality of life and function.  The patient was failing to    respond to conservative measures including medications and/or injections and activity modification and at this point was ready   to proceed with more definitive measures.  Consent was obtained for   benefit of pain relief.  Specific risks of infection, DVT, component   failure, dislocation, neurovascular injury, and need for revision surgery were reviewed in the office as well discussion of   the anterior versus posterior approach were reviewed.     PROCEDURE IN DETAIL:  The patient was brought to operative theater.   Once adequate anesthesia, preoperative antibiotics, 3 gm  of Ancef, 1 gm of Tranexamic Acid, and 10 mg of Decadron were administered, the patient was positioned supine on the [employer] table.  Once the patient was safely positioned with adequate padding of boney prominences we predraped out the hip, and used fluoroscopy to confirm orientation of the pelvis.      The left hip was then prepped and draped from proximal iliac crest to   mid thigh with a shower curtain technique.      Time-out was performed identifying the patient, planned procedure, and the appropriate extremity.     An incision was then made 2 cm lateral to the   anterior superior iliac spine extending over the orientation of the   tensor fascia lata muscle and sharp dissection was carried down to the   fascia of the muscle.      The fascia was then incised.  The muscle belly was identified and swept   laterally and retractor placed along the superior neck.  Following   cauterization of the circumflex vessels and removing  some pericapsular   fat, a second cobra retractor was placed on the inferior neck.  A T-capsulotomy was made along the line of the   superior neck to the trochanteric fossa, then extended proximally and   distally.  Tag sutures were placed and the retractors were then placed   intracapsular.  We then identified the trochanteric fossa and   orientation of my neck cut and then made a neck osteotomy with the femur on traction.  The femoral   head was removed without difficulty or complication.  Traction was let   off and retractors were placed posterior and anterior around the   acetabulum.      The labrum and foveal tissue were debrided.  I began reaming with a 47 mm   reamer and reamed up to 57 mm reamer with good bony bed preparation and a 58 mm  cup was chosen.  The final 58 mm Pinnacle cup was then impacted under fluoroscopy to confirm the depth of penetration and orientation with respect to   Abduction and forward flexion.  A screw was placed into the ilium  followed by the hole eliminator.  The final   36+4 neutral Altrex liner was impacted with good visualized rim fit.  The cup was positioned anatomically within the acetabular portion of the pelvis.      At this point, the femur was rolled to 100 degrees.  Further capsule was   released off the inferior aspect of the femoral neck.  I then   released the superior capsule proximally.  With the leg in a neutral position the hook was placed laterally   along the femur under the vastus lateralis origin and elevated manually and then held in position using the hook attachment on the bed.  The leg was then extended and adducted with the leg rolled to 100   degrees of external rotation.  Retractors were placed along the medial calcar and posteriorly over the greater trochanter.  Once the proximal femur was fully   exposed, I used a box osteotome to set orientation.  I then began   broaching with the starting chili pepper broach and passed this by hand and then broached up to 8.  With the 8 broach in place I chose a high offset neck and did several trial reductions.  The offset was appropriate, leg lengths   appeared to be equal best matched with the +8.5 head ball trial confirmed radiographically.   Given these findings, I went ahead and dislocated the hip, repositioned all   retractors and positioned the right hip in the extended and abducted position.  The final 8 Hi Actis stem was   chosen and it was impacted down to the level of neck cut.  Based on this   and the trial reductions, a final 36+8.5 delta ceramic ball was chosen and   impacted onto a clean and dry trunnion, and the hip was reduced.  The   hip had been irrigated throughout the case again at this point.  I did   reapproximate the superior capsular leaflet to the anterior leaflet   using #1 Vicryl.  The fascia of the   tensor fascia lata muscle was then reapproximated using #1 Vicryl and #0 Stratafix sutures.  The   remaining wound was  closed with 2-0 Vicryl and running 4-0 Monocryl.   The hip was cleaned, dried, and dressed sterilely using Dermabond and   Aquacel dressing.  The patient was then brought  to recovery room in stable condition tolerating the procedure well.    Lanney GinsMatthew Babish, PA-C was present for the entirety of the case involved from   preoperative positioning, perioperative retractor management, general   facilitation of the case, as well as primary wound closure as assistant.            Madlyn FrankelMatthew D. Charlann Boxerlin, M.D.        11/25/2018 8:56 AM

## 2018-11-25 NOTE — Evaluation (Signed)
Physical Therapy Evaluation Patient Details Name: Steven Allen MRN: [id number] DOB: July 31, 1960 Today's Date: 11/25/2018   History of Present Illness  58 yo male s/p L DA-THA on 11/25/18. PMH includes lap gastric sleeve, CAD, obesity, HTN.  Clinical Impression  Pt presents with L hip pain, decreased L hip strength, difficulty performing bed mobility, increased time and effort to perform mobility tasks, and decreased activity tolerance due to L hip pain. Pt to benefit from acute PT to address deficits. Pt ambulated hallway distance with RW with min guard assist, verbal cuing for form and safety provided throughout. Pt educated on ankle pumps (20/hour) to perform this afternoon/evening to increase circulation, to pt's tolerance and limited by pain. PT to progress mobility as tolerated, and will continue to follow acutely.        Follow Up Recommendations Follow surgeon's recommendation for DC plan and follow-up therapies;Supervision for mobility/OOB    Equipment Recommendations  None recommended by PT    Recommendations for Other Services       Precautions / Restrictions Precautions Precautions: Fall Restrictions Weight Bearing Restrictions: No Other Position/Activity Restrictions: WBAT      Mobility  Bed Mobility Overal bed mobility: Needs Assistance Bed Mobility: Supine to Sit     Supine to sit: HOB elevated;Min assist     General bed mobility comments: Min assist for LLE lifting and translation to EOB, increased time and effort to come to sitting with use of bedrails.  Transfers Overall transfer level: Needs assistance Equipment used: Rolling walker (2 wheeled) Transfers: Sit to/from Stand Sit to Stand: Min assist;From elevated surface         General transfer comment: Min assist for power up, steadying. Verbal cuing for hand placement when rising.  Ambulation/Gait Ambulation/Gait assistance: Min guard Gait Distance (Feet): 85 Feet Assistive device: Rolling  walker (2 wheeled) Gait Pattern/deviations: Step-to pattern;Trunk flexed;Decreased step length - right;Decreased step length - left;Decreased weight shift to left;Antalgic Gait velocity: decr   General Gait Details: Min guard for safety, verbal cuing for sequencing, upright posture. Pt reports feet feel "funny" in standing, gait WFL and safe as pt mostly limited by mild L hip pain.  Stairs            Wheelchair Mobility    Modified Rankin (Stroke Patients Only)       Balance Overall balance assessment: Mild deficits observed, not formally tested(pt reports 0 falls)                                           Pertinent Vitals/Pain Pain Assessment: 0-10 Pain Score: 2  Pain Location: L hip Pain Descriptors / Indicators: Sore Pain Intervention(s): Limited activity within patient's tolerance;Monitored during session;Premedicated before session;Repositioned;Ice applied    Home Living Family/patient expects to be discharged to:: Private residence Living Arrangements: Spouse/significant other;Children Available Help at Discharge: Family Type of Home: House Home Access: [place]: One [place]: Environmental consultant - 2 wheels;Cane - single point;Bedside commode;Crutches;Wheelchair - manual;Shower seat      Prior Function Level of Independence: Independent with assistive device(s)         Comments: pt reports using w/c for 2 years PTA, states over the past few weeks he has been using RW and cane for ambulation more in preparation for surgery. Per previous note, pt had power wheelechair, pt did not specify to PT.  Hand Dominance   Dominant Hand: Right    Extremity/Trunk Assessment   Upper Extremity Assessment Upper Extremity Assessment: Overall WFL for tasks assessed    Lower Extremity Assessment Lower Extremity Assessment: Generalized weakness;LLE deficits/detail LLE Deficits / Details: suspected post-surgical hip weakness;  able to perform ankle pumps, quad set, limited heel slide due to pain LLE Sensation: WNL    Cervical / Trunk Assessment Cervical / Trunk Assessment: Normal  Communication   Communication: No difficulties  Cognition Arousal/Alertness: Awake/alert Behavior During Therapy: WFL for tasks assessed/performed Overall Cognitive Status: Within Functional Limits for tasks assessed                                        General Comments      Exercises     Assessment/Plan    PT Assessment Patient needs continued PT services  PT Problem List Decreased strength;Decreased mobility;Decreased range of motion;Decreased activity tolerance;Decreased balance;Decreased knowledge of use of DME;Pain       PT Treatment Interventions DME instruction;Therapeutic activities;Gait training;Therapeutic exercise;Patient/family education;Balance training;Stair training;Functional mobility training    PT Goals (Current goals can be found in the Care Plan section)  Acute Rehab PT Goals Patient Stated Goal: go home PT Goal Formulation: With patient Time For Goal Achievement: 12/02/18 Potential to Achieve Goals: Good    Frequency 7X/week   Barriers to discharge        Co-evaluation               AM-PAC PT "6 Clicks" Mobility  Outcome Measure Help needed turning from your back to your side while in a flat bed without using bedrails?: A Little Help needed moving from lying on your back to sitting on the side of a flat bed without using bedrails?: A Little Help needed moving to and from a bed to a chair (including a wheelchair)?: A Little Help needed standing up from a chair using your arms (e.g., wheelchair or bedside chair)?: A Little Help needed to walk in hospital room?: A Little Help needed climbing 3-5 steps with a railing? : A Lot 6 Click Score: 17    End of Session Equipment Utilized During Treatment: Gait belt Activity Tolerance: Patient tolerated treatment  well Patient left: in chair;with call bell/phone within reach;with SCD's reapplied(posey chair unit not present in room, pt verbally agrees not to mobilize without pressing call button and waiting for RN/NT to assist him back to bed.) Nurse Communication: Mobility status PT Visit Diagnosis: Other abnormalities of gait and mobility (R26.89);Difficulty in walking, not elsewhere classified (R26.2)    Time: [id number] PT Time Calculation (min) (ACUTE ONLY): 16 min   Charges:   PT Evaluation $PT Eval Low Complexity: 1 Low          Nicola PoliceAlexa D Tanielle Emigh, PT Acute Rehabilitation Services Pager [phone]  Office [phone]   Tyrone AppleAlexa D Despina Hiddenure 11/25/2018, 3:34 PM

## 2018-11-25 NOTE — Anesthesia Procedure Notes (Signed)
Procedure Name: MAC Date/Time: 11/25/2018 8:36 AM Performed by: Eben Burow, CRNA Pre-anesthesia Checklist: Patient identified, Emergency Drugs available, Suction available, Patient being monitored and Timeout performed Oxygen Delivery Method: Simple face mask Dental Injury: Teeth and Oropharynx as per pre-operative assessment

## 2018-11-25 NOTE — Anesthesia Postprocedure Evaluation (Signed)
Anesthesia Post Note  Patient: Steven Allen  Procedure(s) Performed: TOTAL HIP ARTHROPLASTY ANTERIOR APPROACH (Left Hip)     Patient location during evaluation: PACU Anesthesia Type: Spinal Level of consciousness: awake and alert Pain management: pain level controlled Vital Signs Assessment: post-procedure vital signs reviewed and stable Respiratory status: spontaneous breathing and respiratory function stable Cardiovascular status: blood pressure returned to baseline and stable Postop Assessment: spinal receding Anesthetic complications: no    Last Vitals:  Vitals:   11/25/18 1519 11/25/18 1620  BP: 122/72 121/75  Pulse: 88 85  Resp: 16 16  Temp: (!) 36.4 C (!) 36.4 C  SpO2: 98% 96%    Last Pain:  Vitals:   11/25/18 1620  TempSrc: Oral  PainSc:                  [name]

## 2018-11-25 NOTE — Anesthesia Procedure Notes (Signed)
Spinal  Patient location during procedure: OR Start time: 11/25/2018 8:34 AM End time: 11/25/2018 8:39 AM Staffing Anesthesiologist: Suzette Battiest, MD Performed: anesthesiologist  Preanesthetic Checklist Completed: patient identified, site marked, surgical consent, pre-op evaluation, timeout performed, IV checked, risks and benefits discussed and monitors and equipment checked Spinal Block Patient position: sitting Prep: DuraPrep Patient monitoring: heart rate, cardiac monitor, continuous pulse ox and blood pressure Approach: midline Location: L3-4 Injection technique: single-shot Needle Needle type: Pencan  Needle gauge: 24 G Needle length: 9 cm Assessment Sensory level: T4

## 2018-11-25 NOTE — Transfer of Care (Signed)
Immediate Anesthesia Transfer of Care Note  Patient: Steven Allen  Procedure(s) Performed: TOTAL HIP ARTHROPLASTY ANTERIOR APPROACH (Left Hip)  Patient Location: PACU  Anesthesia Type:MAC and Spinal  Level of Consciousness: awake, alert , oriented and patient cooperative  Airway & Oxygen Therapy: Patient Spontanous Breathing and Patient connected to face mask oxygen  Post-op Assessment: Report given to RN, Post -op Vital signs reviewed and stable and Patient moving all extremities  Post vital signs: Reviewed and stable  Last Vitals:  Vitals Value Taken Time  BP    Temp    Pulse 53 11/25/18 1051  Resp 19 11/25/18 1051  SpO2 99 % 11/25/18 1051  Vitals shown include unvalidated device data.  Last Pain:  Vitals:   11/25/18 0643  TempSrc: Oral  PainSc:       Patients Stated Pain Goal: 4 (62/22/97 9892)  Complications: No apparent anesthesia complications

## 2018-11-25 NOTE — Discharge Instructions (Signed)

## 2018-11-25 NOTE — Interval H&P Note (Signed)
History and Physical Interval Note:  11/25/2018 7:04 AM  Steven Allen  has presented today for surgery, with the diagnosis of Left hip osteoarthritis.  The various methods of treatment have been discussed with the patient and family. After consideration of risks, benefits and other options for treatment, the patient has consented to  Procedure(s) with comments: TOTAL HIP ARTHROPLASTY ANTERIOR APPROACH (Left) - 70 mins as a surgical intervention.  The patient's history has been reviewed, patient examined, no change in status, stable for surgery.  I have reviewed the patient's chart and labs.  Questions were answered to the patient's satisfaction.     Mauri Pole

## 2018-11-26 ENCOUNTER — Encounter (HOSPITAL_COMMUNITY): Payer: Self-pay | Admitting: Orthopedic Surgery

## 2018-11-26 LAB — BASIC METABOLIC PANEL
Anion gap: 7 (ref 5–15)
BUN: 14 mg/dL (ref 6–20)
CO2: 24 mmol/L (ref 22–32)
Calcium: 8.5 mg/dL — ABNORMAL LOW (ref 8.9–10.3)
Chloride: 108 mmol/L (ref 98–111)
Creatinine, Ser: 0.67 mg/dL (ref 0.61–1.24)
GFR calc Af Amer: >60 mL/min (ref >60)
GFR calc non Af Amer: >60 mL/min (ref >60)
Glucose, Bld: 143 mg/dL — ABNORMAL HIGH (ref 70–99)
Potassium: 3.6 mmol/L (ref 3.5–5.1)
Sodium: 139 mmol/L (ref 135–145)

## 2018-11-26 LAB — CBC
HCT: 32.1 % — ABNORMAL LOW (ref 39.0–52.0)
Hemoglobin: 10.1 g/dL — ABNORMAL LOW (ref 13.0–17.0)
MCH: 29.7 pg (ref 26.0–34.0)
MCHC: 31.5 g/dL (ref 30.0–36.0)
MCV: 94.4 fL (ref 80.0–100.0)
Platelets: 113 10*3/uL — ABNORMAL LOW (ref 150–400)
RBC: 3.4 MIL/uL — ABNORMAL LOW (ref 4.22–5.81)
RDW: 13 % (ref 11.5–15.5)
WBC: 12.4 10*3/uL — ABNORMAL HIGH (ref 4.0–10.5)
nRBC: 0 % (ref 0.0–0.2)

## 2018-11-26 MED ORDER — METHOCARBAMOL 500 MG PO TABS: 500.0000 mg | ORAL_TABLET | Freq: Four times a day (QID) | ORAL | 0 refills | Status: DC | PRN

## 2018-11-26 MED ORDER — FERROUS SULFATE 325 (65 FE) MG PO TABS: 325.0000 mg | ORAL_TABLET | Freq: Three times a day (TID) | ORAL | 0 refills | Status: DC

## 2018-11-26 MED ORDER — DOCUSATE SODIUM 100 MG PO CAPS: 100.0000 mg | ORAL_CAPSULE | Freq: Two times a day (BID) | ORAL | 0 refills | Status: DC

## 2018-11-26 MED ORDER — ASPIRIN 81 MG PO CHEW: 81.0000 mg | CHEWABLE_TABLET | Freq: Two times a day (BID) | ORAL | 0 refills | Status: AC

## 2018-11-26 MED ORDER — HYDROCODONE-ACETAMINOPHEN 7.5-325 MG PO TABS: 1.0000 | ORAL_TABLET | ORAL | 0 refills | Status: DC | PRN

## 2018-11-26 MED ORDER — POLYETHYLENE GLYCOL 3350 17 G PO PACK: 17.0000 g | PACK | Freq: Two times a day (BID) | ORAL | 0 refills | Status: DC

## 2018-11-26 NOTE — Progress Notes (Signed)
Physical Therapy Treatment Patient Details Name: Steven Allen Vogl MRN: [id number] DOB: 12/01/1960 Today's Date: 11/26/2018    History of Present Illness 58 yo male s/p L DA-THA on 11/25/18. PMH includes lap gastric sleeve, CAD, obesity, HTN.    PT Comments    Pt ambulated in hallway and performed bed level exercises.  Pt unable to perform hip flexion in standing with RW.  Pt anticipates d/c home later today.   Follow Up Recommendations  Follow surgeon's recommendation for DC plan and follow-up therapies;Supervision for mobility/OOB     Equipment Recommendations  None recommended by PT    Recommendations for Other Services       Precautions / Restrictions Precautions Precautions: Fall Restrictions Weight Bearing Restrictions: No Other Position/Activity Restrictions: WBAT    Mobility  Bed Mobility Overal bed mobility: Needs Assistance Bed Mobility: Supine to Sit;Sit to Supine     Supine to sit: Min guard Sit to supine: Min assist   General bed mobility comments: verbal cues for self assist, slight assist with LE onto bed  Transfers Overall transfer level: Needs assistance Equipment used: Rolling walker (2 wheeled) Transfers: Sit to/from Stand Sit to Stand: From elevated surface;Min guard         General transfer comment: verbal cues for UE and LE positioning  Ambulation/Gait Ambulation/Gait assistance: Min guard Gait Distance (Feet): 100 Feet Assistive device: Rolling walker (2 wheeled) Gait Pattern/deviations: Step-to pattern;Trunk flexed;Decreased weight shift to left;Antalgic Gait velocity: decr   General Gait Details: min/guard for safety, verbal cues for RW positioning, posture, step length, distance as tolerated and pt fatigued quickly due mostly using w/c prior to surgery   Stairs             Wheelchair Mobility    Modified Rankin (Stroke Patients Only)       Balance                                             Cognition Arousal/Alertness: Awake/alert Behavior During Therapy: WFL for tasks assessed/performed Overall Cognitive Status: Within Functional Limits for tasks assessed                                        Exercises Total Joint Exercises Ankle Circles/Pumps: AROM;Both;10 reps Quad Sets: AROM;Both;10 reps Short Arc QuadBarbaraann Boys: AAROM;Left;10 reps Heel Slides: AAROM;Left;10 reps Hip ABduction/ADduction: AAROM;Left;10 reps    General Comments        Pertinent Vitals/Pain Pain Assessment: 0-10 Pain Score: 4  Pain Location: L hip Pain Descriptors / Indicators: Sore;Aching Pain Intervention(s): Monitored during session;Repositioned;Premedicated before session    Home Living                      Prior Function            PT Goals (current goals can now be found in the care plan section) Progress towards PT goals: Progressing toward goals    Frequency    7X/week      PT Plan Current plan remains appropriate    Co-evaluation              AM-PAC PT "6 Clicks" Mobility   Outcome Measure  Help needed turning from your back to your side while in a flat bed without using bedrails?: A Little  Help needed moving from lying on your back to sitting on the side of a flat bed without using bedrails?: A Little Help needed moving to and from a bed to a chair (including a wheelchair)?: A Little Help needed standing up from a chair using your arms (e.g., wheelchair or bedside chair)?: A Little Help needed to walk in hospital room?: A Little Help needed climbing 3-5 steps with a railing? : A Lot 6 Click Score: 17    End of Session Equipment Utilized During Treatment: Gait belt Activity Tolerance: Patient tolerated treatment well Patient left: in bed;with call bell/phone within reach   PT Visit Diagnosis: Other abnormalities of gait and mobility (R26.89);Difficulty in walking, not elsewhere classified (R26.2)     Time: 5056-9794 PT Time Calculation  (min) (ACUTE ONLY): 15 min  Charges:  $Therapeutic Exercise: 8-22 mins                    Carmelia Bake, PT, DPT Acute Rehabilitation Services Office: [phone] Pager: [phone]   Trena Platt 11/26/2018, 11:53 AM

## 2018-11-26 NOTE — Progress Notes (Signed)
     Subjective: 1 Day Post-Op Procedure(s) (LRB): TOTAL HIP ARTHROPLASTY ANTERIOR APPROACH (Left)   Patient reports pain as mild, pain controlled.  No events throughout the night.  Patient did loose some blood during surgery, but his numbers are good and having no symptoms.  Patient has been moving slowly at home for quite some time and Dr. Alvan Dame stated that if he works with PT and can be safely back at his baseline he can d/c home.   Objective:   VITALS:   Vitals:   11/26/18 0210 11/26/18 0633  BP: 101/60 91/62  Pulse: 63 72  Resp: 16 18  Temp: 98.2 F (36.8 C) 97.6 F (36.4 C)  SpO2: 96% 98%    Dorsiflexion/Plantar flexion intact Incision: dressing C/D/I No cellulitis present Compartment soft  LABS Recent Labs    11/24/18 0900 11/26/18 0513  HGB 15.4 10.1*  HCT 48.7 32.1*  WBC 8.4 12.4*  PLT 154 113*    Recent Labs    11/24/18 0900 11/26/18 0513  NA 139 139  K 3.9 3.6  BUN 24* 14  CREATININE 0.68 0.67  GLUCOSE 107* 143*     Assessment/Plan: 1 Day Post-Op Procedure(s) (LRB): TOTAL HIP ARTHROPLASTY ANTERIOR APPROACH (Left) Foley cath d/c'ed Advance diet Up with therapy D/C IV fluids Discharge home  Follow up in 2 weeks at [facility] ([place]). Follow up with OLIN,Jaylinn Hellenbrand D in 2 weeks.  Contact information:  [facility]) [address], [place] [address] [phone]    Morbid Obesity (BMI >40)  Estimated body mass index is 42.79 kg/m as calculated from the following:   Height as of this encounter: 5\' 10"  (1.778 m).   Weight as of this encounter: 135.3 kg. Patient also counseled that weight may inhibit the healing process Patient counseled that losing weight will help with future health issues        [place] Pugh. Chelly Dombeck   PAC  11/26/2018, 8:10 AM

## 2018-11-26 NOTE — Plan of Care (Signed)
Plan of care reviewed and discussed with the patient. 

## 2018-11-26 NOTE — Progress Notes (Addendum)
Physical Therapy Treatment Patient Details Name: Steven SofiaFranklin Capuano MRN: [id number] DOB: 18-Jun-1960 Today's Date: 11/26/2018    History of Present Illness 58 yo male s/p L DA-THA on 11/25/18. PMH includes lap gastric sleeve, CAD, obesity, HTN.    PT Comments    Pt ambulated again in hallway and reports pain improved this afternoon.  Reviewed HEP handout verbally with pt and he had no further questions.  Pt feels ready for d/c home today.   Follow Up Recommendations  Follow surgeon's recommendation for DC plan and follow-up therapies;Supervision for mobility/OOB     Equipment Recommendations  None recommended by PT    Recommendations for Other Services       Precautions / Restrictions Precautions Precautions: Fall Restrictions Other Position/Activity Restrictions: WBAT    Mobility  Bed Mobility Overal bed mobility: Needs Assistance Bed Mobility: Supine to Sit     Supine to sit: Supervision;HOB elevated Sit to supine: Min assist   General bed mobility comments: pt able to self assist  Transfers Overall transfer level: Needs assistance Equipment used: Rolling walker (2 wheeled) Transfers: Sit to/from Stand Sit to Stand: From elevated surface;Min guard         General transfer comment: verbal cues for UE and LE positioning  Ambulation/Gait Ambulation/Gait assistance: Min guard Gait Distance (Feet): 120 Feet Assistive device: Rolling walker (2 wheeled) Gait Pattern/deviations: Step-to pattern;Trunk flexed;Decreased weight shift to left;Antalgic Gait velocity: decr   General Gait Details: verbal cues for RW positioning, posture, step length   Stairs             Wheelchair Mobility    Modified Rankin (Stroke Patients Only)       Balance                                            Cognition Arousal/Alertness: Awake/alert Behavior During Therapy: WFL for tasks assessed/performed Overall Cognitive Status: Within Functional Limits  for tasks assessed                                        Exercises    General Comments        Pertinent Vitals/Pain Pain Assessment: 0-10 Pain Score: 4  Pain Location: L hip Pain Descriptors / Indicators: Sore;Aching Pain Intervention(s): Monitored during session;Limited activity within patient's tolerance    Home Living                      Prior Function            PT Goals (current goals can now be found in the care plan section) Progress towards PT goals: Progressing toward goals    Frequency    7X/week      PT Plan Current plan remains appropriate    Co-evaluation              AM-PAC PT "6 Clicks" Mobility   Outcome Measure  Help needed turning from your back to your side while in a flat bed without using bedrails?: A Little Help needed moving from lying on your back to sitting on the side of a flat bed without using bedrails?: A Little Help needed moving to and from a bed to a chair (including a wheelchair)?: A Little Help needed standing up from a chair using  your arms (e.g., wheelchair or bedside chair)?: A Little Help needed to walk in hospital room?: A Little Help needed climbing 3-5 steps with a railing? : A Lot 6 Click Score: 17    End of Session Equipment Utilized During Treatment: Gait belt Activity Tolerance: Patient tolerated treatment well Patient left: in bed;with call bell/phone within reach Nurse Communication: Mobility status PT Visit Diagnosis: Other abnormalities of gait and mobility (R26.89);Difficulty in walking, not elsewhere classified (R26.2)     Time: 4270-6237 PT Time Calculation (min) (ACUTE ONLY): 11 min  Charges:  $Gait Training: 8-22 mins                    Carmelia Bake, PT, DPT Acute Rehabilitation Services Office: [phone] Pager: [phone]  Trena Platt 11/26/2018, 1:54 PM

## 2018-11-26 NOTE — Progress Notes (Signed)
Pt was provided with d/c instructions. After discussing the pt's plan of care upon d/c home, the pt reported no further questions or concerns. The pt is currently waiting for their ride to arrive.

## 2018-11-27 NOTE — Discharge Summary (Signed)
Physician Discharge Summary  Patient ID: Steven Allen MRN: [id number] DOB/AGE: [birth year] 58 y.o.  Admit date: 11/25/2018 Discharge date: 11/26/2018   Procedures:  Procedure(s) (LRB): TOTAL HIP ARTHROPLASTY ANTERIOR APPROACH (Left)  Attending Physician:  Dr. Paralee Cancel   Admission Diagnoses:   Left hip primary OA / pain  Discharge Diagnoses:  Principal Problem:   S/P left THA, AA Active Problems:   Severe obesity (BMI >= 40) (HCC)  Past Medical History:  Diagnosis Date   Body mass index (BMI) 45.0-49.9, adult [facility])    Chronic hip pain    Coronary artery disease    cardiology -- dr end-- nuclear stress test 11-22-2017 showed abnormal with intermediate risk , recommended coronary CTA and if no obstruction pt cleared for surgery (per final result non-obstructve cad in distal LAD and RCA   Decreased strength of upper extremity    Exercise counseling    Full dentures    Hyperlipidemia    Hypertension    Morbid obesity due to excess calories ([place])    OA (osteoarthritis)    bilateral hips and knees   Type 2 diabetes mellitus ([place])    followed by pcp   Uses wheelchair    due to bilateral hip pain   Wears glasses     HPI:    Steven Allen, 58 y.o. male, has a history of pain and functional disability in the left hip(s) due to arthritis and patient has failed non-surgical conservative treatments for greater than 12 weeks to include NSAID's and/or analgesics, corticosteriod injections, use of assistive devices and activity modification.  Onset of symptoms was gradual starting 2+ years ago with gradually worsening course since that time.The patient noted no past surgery on the left hip(s).  Patient currently rates pain in the left hip at 9 out of 10 with activity. Patient has night pain, worsening of pain with activity and weight bearing, trendelenberg gait, pain that interfers with activities of daily living and pain with passive range of motion. Patient has  evidence of periarticular osteophytes and joint space narrowing by imaging studies. This condition presents safety issues increasing the risk of falls. There is no current active infection.  Risks, benefits and expectations were discussed with the patient.  Risks including but not limited to the risk of anesthesia, blood clots, nerve damage, blood vessel damage, failure of the prosthesis, infection and up to and including death.  Patient understand the risks, benefits and expectations and wishes to proceed with surgery.   PCP: [name], PA   Discharged Condition: good  Hospital Course:  Patient underwent the above stated procedure on 11/25/2018. Patient tolerated the procedure well and brought to the recovery room in good condition and subsequently to the floor.  POD #1 BP: 91/62 ; Pulse: 72 ; Temp: 97.6 F (36.4 C) ; Resp: 18 Patient reports pain as mild, pain controlled.  No events throughout the night.  Patient did loose some blood during surgery, but his numbers are good and having no symptoms.  Patient has been moving slowly at home for quite some time and Dr. Alvan Dame stated that if he works with PT and can be safely back at his baseline he can d/c home. Dorsiflexion/plantar flexion intact, incision: dressing C/D/I, no cellulitis present and compartment soft.   LABS  Basename    HGB     10.1  HCT     32.1    Discharge Exam: General appearance: alert, cooperative and no distress Extremities: Homans sign is negative, no sign  of DVT, no edema, redness or tenderness in the calves or thighs and no ulcers, gangrene or trophic changes  Disposition:  Home with follow up in 2 weeks   Follow-up Information    Durene Romanslin, Porsche Noguchi, MD. Schedule an appointment as soon as possible for a visit in 2 weeks.   Specialty: Orthopedic Surgery Contact information: [address] [place] [address] [place] [address] [phone]           Discharge Instructions    Call MD / Call 911   Complete  by: As directed    If you experience chest pain or shortness of breath, CALL 911 and be transported to the hospital emergency room.  If you develope a fever above 101 F, pus (white drainage) or increased drainage or redness at the wound, or calf pain, call your surgeon's office.   Change dressing   Complete by: As directed    Maintain surgical dressing until follow up in the clinic. If the edges start to pull up, may reinforce with tape. If the dressing is no longer working, may remove and cover with gauze and tape, but must keep the area dry and clean.  Call with any questions or concerns.   Constipation Prevention   Complete by: As directed    Drink plenty of fluids.  Prune juice may be helpful.  You may use a stool softener, such as Colace (over the counter) 100 mg twice a day.  Use MiraLax (over the counter) for constipation as needed.   Diet - low sodium heart healthy   Complete by: As directed    Discharge instructions   Complete by: As directed    Maintain surgical dressing until follow up in the clinic. If the edges start to pull up, may reinforce with tape. If the dressing is no longer working, may remove and cover with gauze and tape, but must keep the area dry and clean.  [facility]. Call with any questions or concerns.   Increase activity slowly as tolerated   Complete by: As directed    Weight bearing as tolerated with assist device (walker, cane, etc) as directed, use it as long as suggested by your surgeon or therapist, typically at least 4-6 weeks.   TED hose   Complete by: As directed    Use stockings (TED hose) for 2 weeks on both leg(s).  You may remove them at night for sleeping.      Allergies as of 11/26/2018   No Known Allergies     Medication List    STOP taking these medications   naproxen 500 MG tablet Commonly known as: NAPROSYN     TAKE these medications   aspirin 81 MG chewable tablet Commonly known as: Aspirin  Childrens Chew 1 tablet (81 mg total) by mouth 2 (two) times daily. Take for 4 weeks, then resume regular dose.   atorvastatin 20 MG tablet Commonly known as: LIPITOR Take 20 mg by mouth at bedtime.   BARIATRIC MULTIVITAMINS/IRON PO Take 1 tablet by mouth daily.   Calcium Carbonate-Vitamin D 600-400 MG-UNIT tablet Take 1 tablet by mouth 2 (two) times daily.   docusate sodium 100 MG capsule Commonly known as: Colace Take 1 capsule (100 mg total) by mouth 2 (two) times daily.   ferrous sulfate 325 (65 FE) MG tablet Commonly known as: FerrouSul Take 1 tablet (325 mg total) by mouth 3 (three) times daily with meals for 14 days.   HYDROcodone-acetaminophen 7.5-325 MG  tablet Commonly known as: Norco Take 1-2 tablets by mouth every 4 (four) hours as needed for moderate pain.   losartan 50 MG tablet Commonly known as: COZAAR Take 50 mg by mouth daily.   methocarbamol 500 MG tablet Commonly known as: Robaxin Take 1 tablet (500 mg total) by mouth every 6 (six) hours as needed for muscle spasms. What changed:   medication strength  how much to take  when to take this  reasons to take this   polyethylene glycol 17 g packet Commonly known as: MIRALAX / GLYCOLAX Take 17 g by mouth 2 (two) times daily.            Discharge Care Instructions  (From admission, onward)         Start     Ordered   11/26/18 0000  Change dressing    Comments: Maintain surgical dressing until follow up in the clinic. If the edges start to pull up, may reinforce with tape. If the dressing is no longer working, may remove and cover with gauze and tape, but must keep the area dry and clean.  Call with any questions or concerns.   11/26/18 [id number]           Signed: Anastasio AuerbachMatthew S. Siyona Coto   PA-C  11/27/2018, 9:53 AM

## 2018-12-14 NOTE — H&P (Signed)
TOTAL HIP ADMISSION H&P  Patient is admitted for right total hip arthroplasty, anterior approach.  Subjective:  Chief Complaint: Right hip primary OA / pain  HPI: Steven Allen, 58 y.o. male, has a history of pain and functional disability in the right hip(s) due to arthritis and patient has failed non-surgical conservative treatments for greater than 12 weeks to include NSAID's and/or analgesics, use of assistive devices and activity modification.  Onset of symptoms was gradual starting 2+ years ago with gradually worsening course since that time.The patient noted prior procedures of the hip to include arthroplasty on the left hip(s).  Patient currently rates pain in the right hip at 9 out of 10 with activity. Patient has night pain, worsening of pain with activity and weight bearing, trendelenberg gait, pain that interfers with activities of daily living and pain with passive range of motion. Patient has evidence of periarticular osteophytes and joint space narrowing by imaging studies. This condition presents safety issues increasing the risk of falls.  There is no current active infection.  Risks, benefits and expectations were discussed with the patient.  Risks including but not limited to the risk of anesthesia, blood clots, nerve damage, blood vessel damage, failure of the prosthesis, infection and up to and including death.  Patient understand the risks, benefits and expectations and wishes to proceed with surgery.    PCP: Alden HippEverhart, Oluwafemi, PA  D/C Plans:       Home   Post-op Meds:       No Rx given   Tranexamic Acid:      To be given - IV   Decadron:      Is to be given  FYI:      ASA  [place]  DME:   Pt already has equipment  PT:   [facility]  [facility] [address], [place], [place]     Patient Active Problem List   Diagnosis Date Noted  . S/P left THA, AA 11/25/2018  . Coronary artery disease involving native coronary artery of native heart without angina pectoris  10/30/2018  . Severe obesity (BMI >= 40) (HCC) 03/17/2018  . Diabetes mellitus type 2 in obese (HCC) 03/17/2018  . Hypercholesterolemia without hypertriglyceridemia 03/17/2018  . Bilateral primary osteoarthritis of hip 03/17/2018  . Non-occlusive coronary artery disease 03/17/2018  . Severe obesity (HCC) 03/17/2018  . Essential hypertension 10/26/2017  . Preop cardiovascular exam 10/26/2017   Past Medical History:  Diagnosis Date  . Body mass index (BMI) 45.0-49.9, adult (HCC)   . Chronic hip pain   . Coronary artery disease    cardiology -- dr end-- nuclear stress test 11-22-2017 showed abnormal with intermediate risk , recommended coronary CTA and if no obstruction pt cleared for surgery (per final result non-obstructve cad in distal LAD and RCA  . Decreased strength of upper extremity   . Exercise counseling   . Full dentures   . Hyperlipidemia   . Hypertension   . Morbid obesity due to excess calories (HCC)   . OA (osteoarthritis)    bilateral hips and knees  . Type 2 diabetes mellitus (HCC)    followed by pcp  . Uses wheelchair    due to bilateral hip pain  . Wears glasses     Past Surgical History:  Procedure Laterality Date  . INGUINAL HERNIA REPAIR  2009 approx.   "unsure which side"  . LAPAROSCOPIC CHOLECYSTECTOMY  2005 approx.  Marland Kitchen. LAPAROSCOPIC GASTRIC SLEEVE RESECTION N/A 03/17/2018   Procedure: LAPAROSCOPIC GASTRIC SLEEVE  RESECTION, UPPER ENDO, ERAS PATHWAY;  Surgeon: Greer Pickerel, MD;  Location: [facility];  Service: General;  Laterality: N/A;  . TOTAL HIP ARTHROPLASTY Left 11/25/2018   Procedure: TOTAL HIP ARTHROPLASTY ANTERIOR APPROACH;  Surgeon: Paralee Cancel, MD;  Location: [facility];  Service: Orthopedics;  Laterality: Left;  70 mins    No current facility-administered medications for this encounter.    Current Outpatient Medications  Medication Sig Dispense Refill Last Dose  . aspirin (ASPIRIN CHILDRENS) 81 MG chewable tablet Chew 1 tablet (81 mg total) by mouth 2  (two) times daily. Take for 4 weeks, then resume regular dose. 60 tablet 0   . atorvastatin (LIPITOR) 20 MG tablet Take 20 mg by mouth at bedtime.    11/24/2018 at Unknown time  . Calcium Carbonate-Vitamin D 600-400 MG-UNIT tablet Take 1 tablet by mouth 2 (two) times daily.    11/24/2018 at Unknown time  . docusate sodium (COLACE) 100 MG capsule Take 1 capsule (100 mg total) by mouth 2 (two) times daily. 28 capsule 0   . ferrous sulfate (FERROUSUL) 325 (65 FE) MG tablet Take 1 tablet (325 mg total) by mouth 3 (three) times daily with meals for 14 days. 42 tablet 0   . HYDROcodone-acetaminophen (NORCO) 7.5-325 MG tablet Take 1-2 tablets by mouth every 4 (four) hours as needed for moderate pain. 60 tablet 0   . losartan (COZAAR) 50 MG tablet Take 50 mg by mouth daily.   11/24/2018 at Unknown time  . methocarbamol (ROBAXIN) 500 MG tablet Take 1 tablet (500 mg total) by mouth every 6 (six) hours as needed for muscle spasms. 40 tablet 0   . Multiple Vitamins-Minerals (BARIATRIC MULTIVITAMINS/IRON PO) Take 1 tablet by mouth daily.   Past Week at Unknown time  . polyethylene glycol (MIRALAX / GLYCOLAX) 17 g packet Take 17 g by mouth 2 (two) times daily. 28 packet 0    No Known Allergies  Social History   Tobacco Use  . Smoking status: Never Smoker  . Smokeless tobacco: Never Used  Substance Use Topics  . Alcohol use: Never    Frequency: Never    Family History  Problem Relation Age of Onset  . Cancer Other   . Hypertension Other   . Diabetes Other   . Cancer Father        Liver     Review of Systems  Constitutional: Negative.   HENT: Negative.   Eyes: Negative.   Respiratory: Negative.   Cardiovascular: Negative.   Gastrointestinal: Negative.   Genitourinary: Negative.   Musculoskeletal: Positive for joint pain.  Skin: Negative.   Neurological: Negative.   Endo/Heme/Allergies: Negative.   Psychiatric/Behavioral: Negative.     Objective:  Physical Exam  Constitutional: He is  oriented to person, place, and time. He appears well-developed.  HENT:  Head: Normocephalic.  Eyes: Pupils are equal, round, and reactive to light.  Neck: Neck supple. No JVD present. No tracheal deviation present. No thyromegaly present.  Cardiovascular: Normal rate, regular rhythm and intact distal pulses.  Respiratory: Effort normal and breath sounds normal. No respiratory distress. He has no wheezes.  GI: Soft. There is no abdominal tenderness. There is no guarding.  Musculoskeletal:     Right hip: He exhibits decreased range of motion, decreased strength, tenderness and bony tenderness. He exhibits no swelling, no deformity and no laceration.  Lymphadenopathy:    He has no cervical adenopathy.  Neurological: He is alert and oriented to person, place, and time.  Skin: Skin is  warm and dry.  Psychiatric: He has a normal mood and affect.      Labs:  Estimated body mass index is 42.79 kg/m as calculated from the following:   Height as of 11/25/18: 5\' 10"  (1.778 m).   Weight as of 11/25/18: 135.3 kg.   Imaging Review Plain radiographs demonstrate severe degenerative joint disease of the right hip. The bone quality appears to be good for age and reported activity level.      Assessment/Plan:  End stage arthritis, right hip  The patient history, physical examination, clinical judgement of the provider and imaging studies are consistent with end stage degenerative joint disease of the right hip and total hip arthroplasty is deemed medically necessary. The treatment options including medical management, injection therapy, arthroscopy and arthroplasty were discussed at length. The risks and benefits of total hip arthroplasty were presented and reviewed. The risks due to aseptic loosening, infection, stiffness, dislocation/subluxation,  thromboembolic complications and other imponderables were discussed.  The patient acknowledged the explanation, agreed to proceed with the plan and  consent was signed. Patient is being admitted for inpatient treatment for surgery, pain control, PT, OT, prophylactic antibiotics, VTE prophylaxis, progressive ambulation and ADL's and discharge planning.The patient is planning to be discharged home.     Anastasio AuerbachMatthew S. Mohini Heathcock   PA-C  12/14/2018, 10:02 PM

## 2018-12-30 NOTE — Patient Instructions (Addendum)
DUE TO COVID-19 ONLY ONE VISITOR IS ALLOWED TO COME WITH YOU AND STAY IN THE WAITING ROOM ONLY DURING PRE OP AND PROCEDURE DAY OF SURGERY.  THE 1 VISITOR MAY VISIT WITH YOU AFTER SURGERY IN YOUR PRIVATE ROOM DURING VISITING HOURS ONLY!   YOU NEED TO HAVE A COVID 19 TEST ON_Fri 9/4______ @_11 :05______, THIS TEST MUST BE DONE BEFORE SURGERY, COME  [address], [place] , [phone].  [facility])  ONCE YOUR COVID TEST IS COMPLETED, PLEASE BEGIN THE QUARANTINE INSTRUCTIONS AS OUTLINED IN YOUR HANDOUT.                Steven SofiaFranklin Whitehouse    Your procedure is scheduled on: Tuesday 01/06/19   [facility]  Entrance Report to admitting at  9:00 AM     Call this number if you have problems the morning of surgery [phone]    BRUSH YOUR TEETH MORNING OF SURGERY AND RINSE YOUR MOUTH OUT, NO CHEWING GUM CANDY OR MINTS.    Do not eat food After Midnight.   YOU MAY HAVE CLEAR LIQUIDS FROM MIDNIGHT UNTIL 8:30 AM.   At 8:30 AM Please finish the prescribed Pre-Surgery Gatorade drink.  Nothing by mouth after you finish the Gatorade drink !     CLEAR LIQUID DIET   Foods Allowed                                                                     Foods Excluded  Coffee and tea, regular and decaf                             liquids that you cannot  Plain Jell-O any favor except red or purple                                           see through such as: Fruit ices (not with fruit pulp)                                     milk, soups, orange juice  Iced Popsicles                                    All solid food Carbonated beverages, regular and diet                                    Cranberry, grape and apple juices Sports drinks like Gatorade Lightly seasoned clear broth or consume(fat free) Sugar, honey syrup    Take these medicines the morning of surgery with A SIP OF WATER:  None  DO NOT TAKE ANY DIABETIC MEDICATIONS DAY OF YOUR SURGERY        How to Manage Your Diabetes Before and After Surgery  Why is it important to control my blood sugar before and after surgery? .Marland Kitchen  Improving blood sugar levels before and after surgery helps healing and can limit problems. . A way of improving blood sugar control is eating a healthy diet by: o  Eating less sugar and carbohydrates o  Increasing activity/exercise o  Talking with your doctor about reaching your blood sugar goals . High blood sugars (greater than 180 mg/dL) can raise your risk of infections and slow your recovery, so you will need to focus on controlling your diabetes during the weeks before surgery. . Make sure that the doctor who takes care of your diabetes knows about your planned surgery including the date and location.  How do I manage my blood sugar before surgery? . Check your blood sugar at least 4 times a day, starting 2 days before surgery, to make sure that the level is not too high or low. o Check your blood sugar the morning of your surgery when you wake up and every 2 hours until you get to the Short Stay unit. . If your blood sugar is less than 70 mg/dL, you will need to treat for low blood sugar: o Do not take insulin. o Treat a low blood sugar (less than 70 mg/dL) with  cup of clear juice (cranberry or apple), 4 glucose tablets, OR glucose gel. o Recheck blood sugar in 15 minutes after treatment (to make sure it is greater than 70 mg/dL). If your blood sugar is not greater than 70 mg/dL on recheck, call [phone] for further instructions. . Report your blood sugar to the short stay nurse when you get to Short Stay.  . If you are admitted to the hospital after surgery: o Your blood sugar will be checked by the staff and you will probably be given insulin after surgery (instead of oral diabetes medicines) to make sure you have good blood sugar levels. o The goal for blood sugar control after surgery is 80-180 mg/dL.   WHAT DO I DO ABOUT MY DIABETES  MEDICATION?  Marland Kitchen Do not take oral diabetes medicines (pills) the morning of surgery                              You may not have any metal on your body including piercing              Do not wear jewelry,  lotions, powders or deodorant                       Men may shave face and neck.   Do not bring valuables to the hospital. [facility] IS NOT             RESPONSIBLE   FOR VALUABLES.  Contacts, dentures or bridgework may not be worn into surgery.                   Please read over the following fact sheets you were given: _____________________________________________________________________             [facility] - Preparing for Surgery Before surgery, you can play an important role.   Because skin is not sterile, your skin needs to be as free of germs as possible.   You can reduce the number of germs on your skin by washing with CHG (chlorahexidine gluconate) soap before surgery .  CHG is an antiseptic cleaner which kills germs and bonds with the skin to continue killing germs even after washing. Please DO NOT  use if you have an allergy to CHG or antibacterial soaps.   If your skin becomes reddened/irritated stop using the CHG and inform your nurse when you arrive at Short Stay. .  You may shave your face/neck. Please follow these instructions carefully:  1.  Shower with CHG Soap the night before surgery and the  morning of Surgery.  2.  If you choose to wash your hair, wash your hair first as usual with your  normal  shampoo.  3.  After you shampoo, rinse your hair and body thoroughly to remove the  shampoo.                                        4.  Use CHG as you would any other liquid soap.  You can apply chg directly  to the skin and wash                       Gently with a scrungie or clean washcloth.  5.  Apply the CHG Soap to your body ONLY FROM THE NECK DOWN.   Do not use on face/ open                           Wound or open sores. Avoid contact with eyes, ears  mouth and genitals (private parts).                       Wash face,  Genitals (private parts) with your normal soap.             6.  Wash thoroughly, paying special attention to the area where your surgery  will be performed.  7.  Thoroughly rinse your body with warm water from the neck down.  8.  DO NOT shower/wash with your normal soap after using and rinsing off  the CHG Soap.              9.  Pat yourself dry with a clean towel.            10.  Wear clean pajamas.            11.  Place clean sheets on your bed the night of your first shower and do not  sleep with pets. Day of Surgery : Do not apply any lotions/deodorants the morning of surgery.  Please wear clean clothes to the hospital/surgery center.  FAILURE TO FOLLOW THESE INSTRUCTIONS MAY RESULT IN THE CANCELLATION OF YOUR SURGERY PATIENT SIGNATURE_________________________________  NURSE SIGNATURE__________________________________  ________________________________________________________________________   Adam Phenix  An incentive spirometer is a tool that can help keep your lungs clear and active. This tool measures how well you are filling your lungs with each breath. Taking long deep breaths may help reverse or decrease the chance of developing breathing (pulmonary) problems (especially infection) following:  A long period of time when you are unable to move or be active. BEFORE THE PROCEDURE   If the spirometer includes an indicator to show your best effort, your nurse or respiratory therapist will set it to a desired goal.  If possible, sit up straight or lean slightly forward. Try not to slouch.  Hold the incentive spirometer in an upright position. INSTRUCTIONS FOR USE  1. Sit on the edge of your bed if possible, or sit up as far as  you can in bed or on a chair. 2. Hold the incentive spirometer in an upright position. 3. Breathe out normally. 4. Place the mouthpiece in your mouth and seal your lips tightly  around it. 5. Breathe in slowly and as deeply as possible, raising the piston or the ball toward the top of the column. 6. Hold your breath for 3-5 seconds or for as long as possible. Allow the piston or ball to fall to the bottom of the column. 7. Remove the mouthpiece from your mouth and breathe out normally. 8. Rest for a few seconds and repeat Steps 1 through 7 at least 10 times every 1-2 hours when you are awake. Take your time and take a few normal breaths between deep breaths. 9. The spirometer may include an indicator to show your best effort. Use the indicator as a goal to work toward during each repetition. 10. After each set of 10 deep breaths, practice coughing to be sure your lungs are clear. If you have an incision (the cut made at the time of surgery), support your incision when coughing by placing a pillow or rolled up towels firmly against it. Once you are able to get out of bed, walk around indoors and cough well. You may stop using the incentive spirometer when instructed by your caregiver.  RISKS AND COMPLICATIONS  Take your time so you do not get dizzy or light-headed.  If you are in pain, you may need to take or ask for pain medication before doing incentive spirometry. It is harder to take a deep breath if you are having pain. AFTER USE  Rest and breathe slowly and easily.  It can be helpful to keep track of a log of your progress. Your caregiver can provide you with a simple table to help with this. If you are using the spirometer at home, follow these instructions: SEEK MEDICAL CARE IF:   You are having difficultly using the spirometer.  You have trouble using the spirometer as often as instructed.  Your pain medication is not giving enough relief while using the spirometer.  You develop fever of 100.5 F (38.1 C) or higher. SEEK IMMEDIATE MEDICAL CARE IF:   You cough up bloody sputum that had not been present before.  You develop fever of 102 F (38.9 C) or  greater.  You develop worsening pain at or near the incision site. MAKE SURE YOU:   Understand these instructions.  Will watch your condition.  Will get help right away if you are not doing well or get worse. Document Released: 08/27/2006 Document Revised: 07/09/2011 Document Reviewed: 10/28/2006 [facility] Patient Information 2014 [employer] [place].   ________________________________________________________________________

## 2018-12-31 ENCOUNTER — Encounter (HOSPITAL_COMMUNITY)
Admission: RE | Admit: 2018-12-31 | Discharge: 2018-12-31 | Disposition: A | Discharge status: Home / Self Care | Payer: Medicare HMO | Source: Ambulatory Visit | Attending: Orthopedic Surgery | Admitting: Orthopedic Surgery

## 2018-12-31 ENCOUNTER — Other Ambulatory Visit: Payer: Self-pay

## 2018-12-31 ENCOUNTER — Encounter (HOSPITAL_COMMUNITY): Payer: Self-pay

## 2018-12-31 DIAGNOSIS — Z01812 Encounter for preprocedural laboratory examination: Secondary | ICD-10-CM | POA: Insufficient documentation

## 2018-12-31 DIAGNOSIS — M1611 Unilateral primary osteoarthritis, right hip: Secondary | ICD-10-CM | POA: Diagnosis not present

## 2018-12-31 DIAGNOSIS — Z20828 Contact with and (suspected) exposure to other viral communicable diseases: Secondary | ICD-10-CM | POA: Diagnosis not present

## 2018-12-31 DIAGNOSIS — M25551 Pain in right hip: Secondary | ICD-10-CM | POA: Insufficient documentation

## 2018-12-31 LAB — CBC
HCT: 43.4 % (ref 39.0–52.0)
Hemoglobin: 13.2 g/dL (ref 13.0–17.0)
MCH: 28.8 pg (ref 26.0–34.0)
MCHC: 30.4 g/dL (ref 30.0–36.0)
MCV: 94.8 fL (ref 80.0–100.0)
Platelets: 162 10*3/uL (ref 150–400)
RBC: 4.58 MIL/uL (ref 4.22–5.81)
RDW: 13.7 % (ref 11.5–15.5)
WBC: 6.3 10*3/uL (ref 4.0–10.5)
nRBC: 0 % (ref 0.0–0.2)

## 2018-12-31 LAB — BASIC METABOLIC PANEL
Anion gap: 10 (ref 5–15)
BUN: 15 mg/dL (ref 6–20)
CO2: 26 mmol/L (ref 22–32)
Calcium: 9.8 mg/dL (ref 8.9–10.3)
Chloride: 104 mmol/L (ref 98–111)
Creatinine, Ser: 0.78 mg/dL (ref 0.61–1.24)
GFR calc Af Amer: >60 mL/min (ref >60)
GFR calc non Af Amer: >60 mL/min (ref >60)
Glucose, Bld: 113 mg/dL — ABNORMAL HIGH (ref 70–99)
Potassium: 4 mmol/L (ref 3.5–5.1)
Sodium: 140 mmol/L (ref 135–145)

## 2018-12-31 LAB — SURGICAL PCR SCREEN
MRSA, PCR: NEGATIVE
Staphylococcus aureus: NEGATIVE

## 2018-12-31 LAB — HEMOGLOBIN A1C
Hgb A1c MFr Bld: 4.9 % (ref 4.8–5.6)
Mean Plasma Glucose: 93.93 mg/dL

## 2018-12-31 LAB — GLUCOSE, CAPILLARY: Glucose-Capillary: 104 mg/dL — ABNORMAL HIGH (ref 70–99)

## 2018-12-31 NOTE — Progress Notes (Signed)
PCP - Dr. Jerrell Belfast Cardiologist -Dr. Sylvester Harder   Chest x-ray - 01/08/17 EKG - 10/29/18 Stress Test - 11/22/17 ECHO - no Cardiac Cath - no  Sleep Study - yes CPAP - no  Fasting Blood Sugar - 104 Checks Blood Sugar _0____ times a day  Blood Thinner Instructions:Dr. Everhart Aspirin Instructions:stop 5 days before surgery Last Dose:12/31/18  Anesthesia review:   Patient denies shortness of breath, fever, cough and chest pain at PAT appointment yes   Patient verbalized understanding of instructions that were given to them at the PAT appointment. Patient was also instructed that they will need to review over the PAT instructions again at home before surgery. yes

## 2019-01-02 ENCOUNTER — Other Ambulatory Visit (HOSPITAL_COMMUNITY)
Admission: RE | Admit: 2019-01-02 | Discharge: 2019-01-02 | Disposition: A | Discharge status: Home / Self Care | Payer: Medicare HMO | Source: Ambulatory Visit | Attending: Orthopedic Surgery | Admitting: Orthopedic Surgery

## 2019-01-02 DIAGNOSIS — Z01812 Encounter for preprocedural laboratory examination: Secondary | ICD-10-CM | POA: Diagnosis not present

## 2019-01-02 NOTE — Anesthesia Preprocedure Evaluation (Addendum)
Anesthesia Evaluation  Patient identified by MRN, date of birth, ID band Patient awake    Reviewed: Allergy & Precautions, NPO status , Patient's Chart, lab work & pertinent test results  Airway Mallampati: II  TM Distance: >3 FB Neck ROM: Full    Dental no notable dental hx. (+) Upper Dentures, Lower Dentures   Pulmonary neg pulmonary ROS,    Pulmonary exam normal breath sounds clear to auscultation       Cardiovascular hypertension, Normal cardiovascular exam Rhythm:Regular Rate:Normal     Neuro/Psych negative neurological ROS  negative psych ROS   GI/Hepatic negative GI ROS, Neg liver ROS,   Endo/Other  diabetesMorbid obesity  Renal/GU negative Renal ROS  negative genitourinary   Musculoskeletal negative musculoskeletal ROS (+)   Abdominal   Peds negative pediatric ROS (+)  Hematology negative hematology ROS (+)   Anesthesia Other Findings   Reproductive/Obstetrics negative OB ROS                            Anesthesia Physical Anesthesia Plan  ASA: III  Anesthesia Plan: Spinal   Post-op Pain Management:    Induction: Intravenous  PONV Risk Score and Plan: 2 and Ondansetron, Dexamethasone and Treatment may vary due to age or medical condition  Airway Management Planned: Simple Face Mask  Additional Equipment:   Intra-op Plan:   Post-operative Plan:   Informed Consent: I have reviewed the patients History and Physical, chart, labs and discussed the procedure including the risks, benefits and alternatives for the proposed anesthesia with the patient or authorized representative who has indicated his/her understanding and acceptance.     Dental advisory given  Plan Discussed with: CRNA and Surgeon  Anesthesia Plan Comments: (See PAT note 12/31/2018, Konrad Felix, PA-C)       Anesthesia Quick Evaluation

## 2019-01-02 NOTE — Progress Notes (Signed)
Anesthesia Chart Review   Case: [id number] Date/Time: 01/06/19 1115   Procedure: TOTAL HIP ARTHROPLASTY ANTERIOR APPROACH (Right ) - 70 mins   Anesthesia type: Spinal   Pre-op diagnosis: Right hip osteoarthritis   Location: [facility] ROOM 10 / [facility]   Surgeon: Paralee Cancel, MD      DISCUSSION:58 y.o. never smoker with h/o HTN, HLD, DM II, CAD, right hip OA scheduled for above procedure 01/06/2019 with Dr. Paralee Cancel.   S/p left total hip 11/25/2018 with no anesthesia complications noted.   Pt cleared by cardiology 10/30/2018.  Per Kerin Ransom, PA-C, "Given past medical history and time since last visit, based on ACC/AHA guidelines,Steven Barkerwould be at acceptable risk for the planned procedure without further cardiovascular testing.  OK to hold aspirin if needed pre op, resume when safe post op."  Anticipate pt can proceed with planned procedure barring acute status change.   VS: BP (!) 143/81   Pulse 78   Temp 36.9 C (Oral)   Resp 18   Ht 5\' 10"  (1.778 m)   Wt 130.3 kg   SpO2 99%   BMI 41.22 kg/m   PROVIDERS: [name], PA is PCP   Lyman Bishop, MD is Cardiologist  LABS: Labs reviewed: Acceptable for surgery. (all labs ordered are listed, but only abnormal results are displayed)  Labs Reviewed  GLUCOSE, CAPILLARY - Abnormal; Notable for the following components:      Result Value   Glucose-Capillary 104 (*)    All other components within normal limits  BASIC METABOLIC PANEL - Abnormal; Notable for the following components:   Glucose, Bld 113 (*)    All other components within normal limits  SURGICAL PCR SCREEN  CBC  HEMOGLOBIN A1C  TYPE AND SCREEN     IMAGES:   EKG: 10/29/2018 Rate 66 bpm Normal sinus rhythm  Low voltage QRS Inferior infarct, age undetermined Possible anterolateral infarct, age undetermined  CV: Myocardial Perfusion 11/22/2017  Nuclear stress EF: 53%.  No T wave inversion was noted during stress.  There was no ST segment  deviation noted during stress.  Defect 1: There is a large defect of moderate severity.  Findings consistent with prior myocardial infarction.  This is an intermediate risk study.   Large size, moderate intensity mostly-fixed apical, apical septal/anterior/lateral perfusion defect, suggestive of scar with minimal peri-infarct ischemia. LVEF 53% with apical hypokinesis. This is an intermediate risk study. Past Medical History:  Diagnosis Date  . Body mass index (BMI) 45.0-49.9, adult ([place])   . Chronic hip pain   . Coronary artery disease    cardiology -- dr end-- nuclear stress test 11-22-2017 showed abnormal with intermediate risk , recommended coronary CTA and if no obstruction pt cleared for surgery (per final result non-obstructve cad in distal LAD and RCA  . Decreased strength of upper extremity   . Exercise counseling   . Full dentures   . Hyperlipidemia   . Hypertension   . Morbid obesity due to excess calories ([place])   . OA (osteoarthritis)    bilateral hips and knees  . Type 2 diabetes mellitus ([place])    followed by pcp  . Uses wheelchair    due to bilateral hip pain  . Wears glasses     Past Surgical History:  Procedure Laterality Date  . INGUINAL HERNIA REPAIR  2009 approx.   "unsure which side"  . LAPAROSCOPIC CHOLECYSTECTOMY  2005 approx.  Marland Kitchen LAPAROSCOPIC GASTRIC SLEEVE RESECTION N/A 03/17/2018   Procedure: LAPAROSCOPIC GASTRIC SLEEVE  RESECTION, UPPER ENDO, ERAS PATHWAY;  Surgeon: Gaynelle AduWilson, Eric, MD;  Location: [facility];  Service: General;  Laterality: N/A;  . TOTAL HIP ARTHROPLASTY Left 11/25/2018   Procedure: TOTAL HIP ARTHROPLASTY ANTERIOR APPROACH;  Surgeon: Durene Romanslin, Matthew, MD;  Location: [facility];  Service: Orthopedics;  Laterality: Left;  70 mins    MEDICATIONS: . atorvastatin (LIPITOR) 20 MG tablet  . Calcium Carbonate-Vitamin D 600-400 MG-UNIT tablet  . docusate sodium (COLACE) 100 MG capsule  . ferrous sulfate (FERROUSUL) 325 (65 FE) MG tablet  .  HYDROcodone-acetaminophen (NORCO) 7.5-325 MG tablet  . losartan (COZAAR) 50 MG tablet  . methocarbamol (ROBAXIN) 500 MG tablet  . methocarbamol (ROBAXIN) 750 MG tablet  . Multiple Vitamins-Minerals (BARIATRIC MULTIVITAMINS/IRON PO)  . polyethylene glycol (MIRALAX / GLYCOLAX) 17 g packet   No current facility-administered medications for this encounter.     [name], PA-C [facility] [phone] 01/02/19  10:44 AM

## 2019-01-03 LAB — NOVEL CORONAVIRUS, NAA (HOSP ORDER, SEND-OUT TO REF LAB; TAT 18-24 HRS): SARS-CoV-2, NAA: NOT DETECTED

## 2019-01-05 MED ORDER — DEXTROSE 5 % IV SOLN
3.0000 g | INTRAVENOUS | Status: AC
  Administered 2019-01-06: 3 g via INTRAVENOUS
  Filled 2019-01-05: qty 3

## 2019-01-06 ENCOUNTER — Encounter (HOSPITAL_COMMUNITY)
Admission: RE | Disposition: A | Discharge status: Home / Self Care | Payer: Self-pay | Source: Home / Self Care | Attending: Orthopedic Surgery

## 2019-01-06 ENCOUNTER — Other Ambulatory Visit: Payer: Self-pay

## 2019-01-06 ENCOUNTER — Inpatient Hospital Stay (HOSPITAL_COMMUNITY): Payer: Medicare HMO | Admitting: Physician Assistant

## 2019-01-06 ENCOUNTER — Inpatient Hospital Stay (HOSPITAL_COMMUNITY): Payer: Medicare HMO | Admitting: Anesthesiology

## 2019-01-06 ENCOUNTER — Inpatient Hospital Stay (HOSPITAL_COMMUNITY)
Admission: RE | Admit: 2019-01-06 | Discharge: 2019-01-07 | DRG: 470 | Disposition: A | Discharge status: Home / Self Care | Payer: Medicare HMO | Attending: Orthopedic Surgery | Admitting: Orthopedic Surgery

## 2019-01-06 ENCOUNTER — Inpatient Hospital Stay (HOSPITAL_COMMUNITY): Payer: Medicare HMO

## 2019-01-06 ENCOUNTER — Encounter (HOSPITAL_COMMUNITY): Payer: Self-pay | Admitting: *Deleted

## 2019-01-06 DIAGNOSIS — I10 Essential (primary) hypertension: Secondary | ICD-10-CM | POA: Diagnosis present

## 2019-01-06 DIAGNOSIS — Z800 Family history of malignant neoplasm of digestive organs: Secondary | ICD-10-CM

## 2019-01-06 DIAGNOSIS — Z833 Family history of diabetes mellitus: Secondary | ICD-10-CM | POA: Diagnosis not present

## 2019-01-06 DIAGNOSIS — Z6841 Body Mass Index (BMI) 40.0 and over, adult: Secondary | ICD-10-CM | POA: Diagnosis not present

## 2019-01-06 DIAGNOSIS — Z96649 Presence of unspecified artificial hip joint: Secondary | ICD-10-CM

## 2019-01-06 DIAGNOSIS — Z8249 Family history of ischemic heart disease and other diseases of the circulatory system: Secondary | ICD-10-CM

## 2019-01-06 DIAGNOSIS — Z9049 Acquired absence of other specified parts of digestive tract: Secondary | ICD-10-CM

## 2019-01-06 DIAGNOSIS — E119 Type 2 diabetes mellitus without complications: Secondary | ICD-10-CM | POA: Diagnosis present

## 2019-01-06 DIAGNOSIS — Z7982 Long term (current) use of aspirin: Secondary | ICD-10-CM | POA: Diagnosis not present

## 2019-01-06 DIAGNOSIS — Z96642 Presence of left artificial hip joint: Secondary | ICD-10-CM | POA: Diagnosis present

## 2019-01-06 DIAGNOSIS — Z419 Encounter for procedure for purposes other than remedying health state, unspecified: Secondary | ICD-10-CM

## 2019-01-06 DIAGNOSIS — Z79891 Long term (current) use of opiate analgesic: Secondary | ICD-10-CM

## 2019-01-06 DIAGNOSIS — Z96641 Presence of right artificial hip joint: Secondary | ICD-10-CM

## 2019-01-06 DIAGNOSIS — E7800 Pure hypercholesterolemia, unspecified: Secondary | ICD-10-CM | POA: Diagnosis present

## 2019-01-06 DIAGNOSIS — Z79899 Other long term (current) drug therapy: Secondary | ICD-10-CM

## 2019-01-06 DIAGNOSIS — Z9884 Bariatric surgery status: Secondary | ICD-10-CM

## 2019-01-06 DIAGNOSIS — M1611 Unilateral primary osteoarthritis, right hip: Principal | ICD-10-CM | POA: Diagnosis present

## 2019-01-06 DIAGNOSIS — I2510 Atherosclerotic heart disease of native coronary artery without angina pectoris: Secondary | ICD-10-CM | POA: Diagnosis present

## 2019-01-06 DIAGNOSIS — E785 Hyperlipidemia, unspecified: Secondary | ICD-10-CM | POA: Diagnosis present

## 2019-01-06 HISTORY — PX: TOTAL HIP ARTHROPLASTY: SHX124

## 2019-01-06 LAB — TYPE AND SCREEN
ABO/RH(D): B POS
Antibody Screen: NEGATIVE

## 2019-01-06 LAB — GLUCOSE, CAPILLARY
Glucose-Capillary: 122 mg/dL — ABNORMAL HIGH (ref 70–99)
Glucose-Capillary: 114 mg/dL — ABNORMAL HIGH (ref 70–99)

## 2019-01-06 SURGERY — ARTHROPLASTY, HIP, TOTAL, ANTERIOR APPROACH
Anesthesia: Spinal | Site: Hip | Laterality: Right

## 2019-01-06 MED ORDER — BUPIVACAINE IN DEXTROSE 0.75-8.25 % IT SOLN
INTRATHECAL | Status: DC | PRN
  Administered 2019-01-06: 2 mL via INTRATHECAL

## 2019-01-06 MED ORDER — MIDAZOLAM HCL 5 MG/5ML IJ SOLN
INTRAMUSCULAR | Status: DC | PRN
  Administered 2019-01-06: 2 mg via INTRAVENOUS

## 2019-01-06 MED ORDER — HYDROMORPHONE HCL 1 MG/ML IJ SOLN
0.2500 mg | INTRAMUSCULAR | Status: DC | PRN
  Administered 2019-01-06 (×2): 0.5 mg via INTRAVENOUS

## 2019-01-06 MED ORDER — FERROUS SULFATE 325 (65 FE) MG PO TABS
325.0000 mg | ORAL_TABLET | Freq: Three times a day (TID) | ORAL | Status: DC
  Administered 2019-01-07 (×2): 325 mg via ORAL
  Filled 2019-01-06 (×2): qty 1

## 2019-01-06 MED ORDER — CELECOXIB 200 MG PO CAPS
200.0000 mg | ORAL_CAPSULE | Freq: Two times a day (BID) | ORAL | Status: DC
  Administered 2019-01-06 – 2019-01-07 (×2): 200 mg via ORAL
  Filled 2019-01-06 (×2): qty 1

## 2019-01-06 MED ORDER — SODIUM CHLORIDE 0.9 % IV SOLN
INTRAVENOUS | Status: DC
  Administered 2019-01-06: 18:00:00 via INTRAVENOUS

## 2019-01-06 MED ORDER — DEXAMETHASONE SODIUM PHOSPHATE 10 MG/ML IJ SOLN: 10.0000 mg | Freq: Once | INTRAMUSCULAR | Status: DC

## 2019-01-06 MED ORDER — METHOCARBAMOL 500 MG IVPB - SIMPLE MED
500.0000 mg | Freq: Four times a day (QID) | INTRAVENOUS | Status: DC | PRN
  Administered 2019-01-06: 500 mg via INTRAVENOUS
  Filled 2019-01-06: qty 50

## 2019-01-06 MED ORDER — HYDROCODONE-ACETAMINOPHEN 7.5-325 MG PO TABS: 1.0000 | ORAL_TABLET | ORAL | Status: DC | PRN

## 2019-01-06 MED ORDER — ACETAMINOPHEN 325 MG PO TABS: 325.0000 mg | ORAL_TABLET | Freq: Four times a day (QID) | ORAL | Status: DC | PRN

## 2019-01-06 MED ORDER — SODIUM CHLORIDE 0.9 % IR SOLN
Status: DC | PRN
  Administered 2019-01-06: 1000 mL

## 2019-01-06 MED ORDER — TRANEXAMIC ACID-NACL 1000-0.7 MG/100ML-% IV SOLN
1000.0000 mg | Freq: Once | INTRAVENOUS | Status: AC
  Administered 2019-01-06: 1000 mg via INTRAVENOUS
  Filled 2019-01-06: qty 100

## 2019-01-06 MED ORDER — POLYETHYLENE GLYCOL 3350 17 G PO PACK
17.0000 g | PACK | Freq: Two times a day (BID) | ORAL | Status: DC
  Administered 2019-01-06 – 2019-01-07 (×2): 17 g via ORAL
  Filled 2019-01-06 (×2): qty 1

## 2019-01-06 MED ORDER — FENTANYL CITRATE (PF) 100 MCG/2ML IJ SOLN
INTRAMUSCULAR | Status: DC | PRN
  Administered 2019-01-06: 100 ug via INTRAVENOUS

## 2019-01-06 MED ORDER — LOSARTAN POTASSIUM 50 MG PO TABS
50.0000 mg | ORAL_TABLET | Freq: Every day | ORAL | Status: DC
  Administered 2019-01-07: 50 mg via ORAL
  Filled 2019-01-06: qty 1

## 2019-01-06 MED ORDER — BISACODYL 10 MG RE SUPP: 10.0000 mg | Freq: Every day | RECTAL | Status: DC | PRN

## 2019-01-06 MED ORDER — ONDANSETRON HCL 4 MG/2ML IJ SOLN: 4.0000 mg | Freq: Four times a day (QID) | INTRAMUSCULAR | Status: DC | PRN

## 2019-01-06 MED ORDER — HYDROMORPHONE HCL 1 MG/ML IJ SOLN
INTRAMUSCULAR | Status: AC
  Filled 2019-01-06: qty 1

## 2019-01-06 MED ORDER — FENTANYL CITRATE (PF) 100 MCG/2ML IJ SOLN
INTRAMUSCULAR | Status: AC
  Filled 2019-01-06: qty 2

## 2019-01-06 MED ORDER — CEFAZOLIN SODIUM-DEXTROSE 2-4 GM/100ML-% IV SOLN
2.0000 g | Freq: Four times a day (QID) | INTRAVENOUS | Status: AC
  Administered 2019-01-06 (×2): 2 g via INTRAVENOUS
  Filled 2019-01-06 (×2): qty 100

## 2019-01-06 MED ORDER — ASPIRIN 81 MG PO CHEW
81.0000 mg | CHEWABLE_TABLET | Freq: Two times a day (BID) | ORAL | Status: DC
  Administered 2019-01-06 – 2019-01-07 (×2): 81 mg via ORAL
  Filled 2019-01-06 (×2): qty 1

## 2019-01-06 MED ORDER — METHOCARBAMOL 500 MG IVPB - SIMPLE MED
INTRAVENOUS | Status: AC
  Filled 2019-01-06: qty 50

## 2019-01-06 MED ORDER — DIPHENHYDRAMINE HCL 12.5 MG/5ML PO ELIX: 12.5000 mg | ORAL_SOLUTION | ORAL | Status: DC | PRN

## 2019-01-06 MED ORDER — PROPOFOL 10 MG/ML IV BOLUS
INTRAVENOUS | Status: AC
  Filled 2019-01-06: qty 60

## 2019-01-06 MED ORDER — CHLORHEXIDINE GLUCONATE 4 % EX LIQD: 60.0000 mL | Freq: Once | CUTANEOUS | Status: DC

## 2019-01-06 MED ORDER — HYDROMORPHONE HCL 1 MG/ML IJ SOLN: 0.5000 mg | INTRAMUSCULAR | Status: DC | PRN

## 2019-01-06 MED ORDER — PROPOFOL 10 MG/ML IV BOLUS
INTRAVENOUS | Status: DC | PRN
  Administered 2019-01-06: 20 mg via INTRAVENOUS
  Administered 2019-01-06: 10 mg via INTRAVENOUS

## 2019-01-06 MED ORDER — ONDANSETRON HCL 4 MG/2ML IJ SOLN
INTRAMUSCULAR | Status: AC
  Filled 2019-01-06: qty 2

## 2019-01-06 MED ORDER — MENTHOL 3 MG MT LOZG: 1.0000 | LOZENGE | OROMUCOSAL | Status: DC | PRN

## 2019-01-06 MED ORDER — TRANEXAMIC ACID-NACL 1000-0.7 MG/100ML-% IV SOLN
INTRAVENOUS | Status: AC
  Filled 2019-01-06: qty 100

## 2019-01-06 MED ORDER — LACTATED RINGERS IV SOLN
INTRAVENOUS | Status: DC
  Administered 2019-01-06 (×3): via INTRAVENOUS

## 2019-01-06 MED ORDER — DEXAMETHASONE SODIUM PHOSPHATE 10 MG/ML IJ SOLN
INTRAMUSCULAR | Status: AC
  Filled 2019-01-06: qty 1

## 2019-01-06 MED ORDER — PROPOFOL 500 MG/50ML IV EMUL
INTRAVENOUS | Status: DC | PRN
  Administered 2019-01-06: 100 ug/kg/min via INTRAVENOUS

## 2019-01-06 MED ORDER — HYDROCODONE-ACETAMINOPHEN 5-325 MG PO TABS
1.0000 | ORAL_TABLET | ORAL | Status: DC | PRN
  Administered 2019-01-06 – 2019-01-07 (×2): 1 via ORAL
  Filled 2019-01-06 (×2): qty 1

## 2019-01-06 MED ORDER — DOCUSATE SODIUM 100 MG PO CAPS
100.0000 mg | ORAL_CAPSULE | Freq: Two times a day (BID) | ORAL | Status: DC
  Administered 2019-01-06 – 2019-01-07 (×2): 100 mg via ORAL
  Filled 2019-01-06 (×2): qty 1

## 2019-01-06 MED ORDER — METOCLOPRAMIDE HCL 5 MG/ML IJ SOLN: 5.0000 mg | Freq: Three times a day (TID) | INTRAMUSCULAR | Status: DC | PRN

## 2019-01-06 MED ORDER — MAGNESIUM CITRATE PO SOLN: 1.0000 | Freq: Once | ORAL | Status: DC | PRN

## 2019-01-06 MED ORDER — DEXAMETHASONE SODIUM PHOSPHATE 10 MG/ML IJ SOLN
INTRAMUSCULAR | Status: DC | PRN
  Administered 2019-01-06: 10 mg via INTRAVENOUS

## 2019-01-06 MED ORDER — METOCLOPRAMIDE HCL 5 MG PO TABS: 5.0000 mg | ORAL_TABLET | Freq: Three times a day (TID) | ORAL | Status: DC | PRN

## 2019-01-06 MED ORDER — ONDANSETRON HCL 4 MG PO TABS: 4.0000 mg | ORAL_TABLET | Freq: Four times a day (QID) | ORAL | Status: DC | PRN

## 2019-01-06 MED ORDER — PROMETHAZINE HCL 25 MG/ML IJ SOLN: 6.2500 mg | INTRAMUSCULAR | Status: DC | PRN

## 2019-01-06 MED ORDER — METHOCARBAMOL 500 MG PO TABS
500.0000 mg | ORAL_TABLET | Freq: Four times a day (QID) | ORAL | Status: DC | PRN
  Administered 2019-01-06 – 2019-01-07 (×2): 500 mg via ORAL
  Filled 2019-01-06 (×2): qty 1

## 2019-01-06 MED ORDER — TRANEXAMIC ACID-NACL 1000-0.7 MG/100ML-% IV SOLN
1000.0000 mg | INTRAVENOUS | Status: AC
  Administered 2019-01-06: 1000 mg via INTRAVENOUS

## 2019-01-06 MED ORDER — MIDAZOLAM HCL 2 MG/2ML IJ SOLN
INTRAMUSCULAR | Status: AC
  Filled 2019-01-06: qty 2

## 2019-01-06 MED ORDER — ALUM & MAG HYDROXIDE-SIMETH 200-200-20 MG/5ML PO SUSP: 15.0000 mL | ORAL | Status: DC | PRN

## 2019-01-06 MED ORDER — ATORVASTATIN CALCIUM 20 MG PO TABS
20.0000 mg | ORAL_TABLET | Freq: Every day | ORAL | Status: DC
  Administered 2019-01-06: 20 mg via ORAL
  Filled 2019-01-06: qty 1

## 2019-01-06 MED ORDER — DEXAMETHASONE SODIUM PHOSPHATE 10 MG/ML IJ SOLN
10.0000 mg | Freq: Once | INTRAMUSCULAR | Status: AC
  Administered 2019-01-07: 10 mg via INTRAVENOUS
  Filled 2019-01-06: qty 1

## 2019-01-06 MED ORDER — PHENOL 1.4 % MT LIQD
1.0000 | OROMUCOSAL | Status: DC | PRN
  Filled 2019-01-06: qty 177

## 2019-01-06 SURGICAL SUPPLY — 46 items
BAG DECANTER FOR FLEXI CONT (MISCELLANEOUS) IMPLANT
BAG ZIPLOCK 12X15 (MISCELLANEOUS) ×1 IMPLANT
BLADE SAG 18X100X1.27 (BLADE) ×2 IMPLANT
BLADE SURG SZ10 CARB STEEL (BLADE) ×2 IMPLANT
COVER PERINEAL POST (MISCELLANEOUS) ×2 IMPLANT
COVER SURGICAL LIGHT HANDLE (MISCELLANEOUS) ×2 IMPLANT
COVER WAND RF STERILE (DRAPES) IMPLANT
CUP ACET PINNACLE SECTR 64MM (Hips) IMPLANT
DERMABOND ADVANCED (GAUZE/BANDAGES/DRESSINGS) ×1
DERMABOND ADVANCED .7 DNX12 (GAUZE/BANDAGES/DRESSINGS) ×1 IMPLANT
DRAPE STERI IOBAN 125X83 (DRAPES) ×2 IMPLANT
DRAPE U-SHAPE 47X51 STRL (DRAPES) ×4 IMPLANT
DRESSING AQUACEL AG SP 3.5X10 (GAUZE/BANDAGES/DRESSINGS) ×1 IMPLANT
DRSG AQUACEL AG SP 3.5X10 (GAUZE/BANDAGES/DRESSINGS) ×2
DURAPREP 26ML APPLICATOR (WOUND CARE) ×2 IMPLANT
ELECT BLADE TIP CTD 4 INCH (ELECTRODE) ×1 IMPLANT
ELECT REM PT RETURN 15FT ADLT (MISCELLANEOUS) ×2 IMPLANT
ELIMINATOR HOLE APEX DEPUY (Hips) ×1 IMPLANT
GLOVE BIO SURGEON STRL SZ 6 (GLOVE) ×2 IMPLANT
GLOVE BIOGEL PI IND STRL 6.5 (GLOVE) ×1 IMPLANT
GLOVE BIOGEL PI IND STRL 7.5 (GLOVE) ×1 IMPLANT
GLOVE BIOGEL PI IND STRL 8.5 (GLOVE) ×1 IMPLANT
GLOVE BIOGEL PI INDICATOR 6.5 (GLOVE)
GLOVE BIOGEL PI INDICATOR 7.5 (GLOVE) ×1
GLOVE BIOGEL PI INDICATOR 8.5 (GLOVE) ×1
GLOVE ECLIPSE 8.0 STRL XLNG CF (GLOVE) ×4 IMPLANT
GLOVE ORTHO TXT STRL SZ7.5 (GLOVE) ×4 IMPLANT
GOWN STRL REUS W/TWL LRG LVL3 (GOWN DISPOSABLE) ×4 IMPLANT
GOWN STRL REUS W/TWL XL LVL3 (GOWN DISPOSABLE) ×2 IMPLANT
HEAD CERAMIC 36 PLUS5 (Hips) ×1 IMPLANT
HOLDER FOLEY CATH W/STRAP (MISCELLANEOUS) ×2 IMPLANT
KIT TURNOVER KIT A (KITS) ×1 IMPLANT
LINER NEUTRAL 64X36 P4 (Hips) ×1 IMPLANT
PACK ANTERIOR HIP CUSTOM (KITS) ×2 IMPLANT
PINNACLE SECTOR CUP 64MM (Hips) ×2 IMPLANT
SCREW 6.5MMX35MM (Screw) ×1 IMPLANT
STEM FEMORAL SZ9 HIGH ACTIS (Stem) ×1 IMPLANT
SUT MNCRL AB 4-0 PS2 18 (SUTURE) ×2 IMPLANT
SUT STRATAFIX 0 PDS 27 VIOLET (SUTURE) ×2
SUT VIC AB 1 CT1 36 (SUTURE) ×6 IMPLANT
SUT VIC AB 2-0 CT1 27 (SUTURE) ×2
SUT VIC AB 2-0 CT1 TAPERPNT 27 (SUTURE) ×2 IMPLANT
SUTURE STRATFX 0 PDS 27 VIOLET (SUTURE) ×1 IMPLANT
TRAY FOLEY MTR SLVR 14FR STAT (SET/KITS/TRAYS/PACK) ×1 IMPLANT
WATER STERILE IRR 1000ML POUR (IV SOLUTION) ×3 IMPLANT
YANKAUER SUCT BULB TIP 10FT TU (MISCELLANEOUS) ×1 IMPLANT

## 2019-01-06 NOTE — Anesthesia Procedure Notes (Signed)
Spinal  Patient location during procedure: OR Start time: 01/06/2019 11:00 AM End time: 01/06/2019 11:05 AM Staffing Anesthesiologist: Myrtie Soman, MD Preanesthetic Checklist Completed: patient identified, site marked, surgical consent, pre-op evaluation, timeout performed, IV checked, risks and benefits discussed and monitors and equipment checked Spinal Block Patient position: sitting Prep: DuraPrep Patient monitoring: heart rate, cardiac monitor, continuous pulse ox and blood pressure Approach: midline Location: L3-4 Injection technique: single-shot Needle Needle type: Sprotte  Needle gauge: 24 G Needle length: 9 cm Assessment Sensory level: T6

## 2019-01-06 NOTE — Interval H&P Note (Signed)
History and Physical Interval Note:  01/06/2019 9:53 AM  Steven Allen  has presented today for surgery, with the diagnosis of Right hip osteoarthritis.  The various methods of treatment have been discussed with the patient and family. After consideration of risks, benefits and other options for treatment, the patient has consented to  Procedure(s) with comments: TOTAL HIP ARTHROPLASTY ANTERIOR APPROACH (Right) - 70 mins as a surgical intervention.  The patient's history has been reviewed, patient examined, no change in status, stable for surgery.  I have reviewed the patient's chart and labs.  Questions were answered to the patient's satisfaction.     Mauri Pole

## 2019-01-06 NOTE — Transfer of Care (Signed)
Immediate Anesthesia Transfer of Care Note  Patient: Steven Allen  Procedure(s) Performed: TOTAL HIP ARTHROPLASTY ANTERIOR APPROACH (Right Hip)  Patient Location: PACU  Anesthesia Type:Spinal  Level of Consciousness: drowsy  Airway & Oxygen Therapy: Patient Spontanous Breathing and Patient connected to face mask oxygen  Post-op Assessment: Report given to RN and Post -op Vital signs reviewed and stable  Post vital signs: Reviewed and stable  Last Vitals:  Vitals Value Taken Time  BP 102/70 01/06/19 1256  Temp    Pulse 64 01/06/19 1258  Resp 16 01/06/19 1258  SpO2 100 % 01/06/19 1258  Vitals shown include unvalidated device data.  Last Pain:  Vitals:   01/06/19 0905  TempSrc: Oral         Complications: No apparent anesthesia complications

## 2019-01-06 NOTE — Plan of Care (Signed)
  Problem: Education: Goal: Knowledge of the prescribed therapeutic regimen will improve Outcome: Progressing   Problem: Activity: Goal: Ability to avoid complications of mobility impairment will improve Outcome: Progressing   Problem: Pain Management: Goal: Pain level will decrease with appropriate interventions Outcome: Progressing   

## 2019-01-06 NOTE — Discharge Instructions (Signed)

## 2019-01-06 NOTE — Progress Notes (Signed)
   01/06/19 2318  Oxygen Therapy  SpO2 91 %  O2 Device Room Air   Pt placed on 2L

## 2019-01-06 NOTE — Evaluation (Signed)
Physical Therapy Evaluation Patient Details Name: Steven Allen MRN: [id number] DOB: May 22, 1960 Today's Date: 01/06/2019   History of Present Illness  58 yo male s/p R DA-THA on 01/06/19. PMH includes L THA 11/25/18, CAD, obesity, DM, OA, HTN, lap gastric sleeve.  Clinical Impression  Pt presents with R hip pain, decreased R hip strength post-operatively, increased time and effort to perform mobility tasks, and decreased activity tolerance due to decreased sensation of feet in WB. Pt to benefit from acute PT to address deficits. Pt ambulated 10 ft with RW with min guard assist, distance limited by decreased sensation in bilat feet. Pt educated on ankle pumps (20/hour) to perform this afternoon/evening to increase circulation, to pt's tolerance and limited by pain. PT to progress mobility as tolerated, and will continue to follow acutely.        Follow Up Recommendations Follow surgeon's recommendation for DC plan and follow-up therapies;Supervision for mobility/OOB    Equipment Recommendations  None recommended by PT    Recommendations for Other Services       Precautions / Restrictions Precautions Precautions: Fall Restrictions Weight Bearing Restrictions: No Other Position/Activity Restrictions: WBAT      Mobility  Bed Mobility Overal bed mobility: Needs Assistance Bed Mobility: Supine to Sit     Supine to sit: Min assist;HOB elevated     General bed mobility comments: Min assist for RLE lifting, translation to EOB. Increased time and effort to perform with use of bed rails.  Transfers Overall transfer level: Needs assistance Equipment used: Rolling walker (2 wheeled) Transfers: Sit to/from Stand Sit to Stand: From elevated surface;Min guard         General transfer comment: Min guard for safety, verbal cuing for hand placement when rising.  Ambulation/Gait Ambulation/Gait assistance: Min guard Gait Distance (Feet): 10 Feet Assistive device: Rolling walker (2  wheeled) Gait Pattern/deviations: Step-to pattern;Trunk flexed;Antalgic;Decreased step length - right;Decreased step length - left;Decreased stance time - right;Decreased weight shift to right Gait velocity: decr   General Gait Details: Min guard for safety, verbal cuing for sequencing. Pt with decreased sensation in feet, evidenced in steppage gait. PT and pt limited ambulation distance to 10 ft due to this.  Stairs            Wheelchair Mobility    Modified Rankin (Stroke Patients Only)       Balance Overall balance assessment: Mild deficits observed, not formally tested;History of Falls(1 fall since last admission)                                           Pertinent Vitals/Pain Pain Assessment: 0-10 Pain Score: 3  Pain Location: R hip Pain Descriptors / Indicators: Sore Pain Intervention(s): Limited activity within patient's tolerance;Monitored during session;Repositioned;Premedicated before session    Home Living Family/patient expects to be discharged to:: Private residence Living Arrangements: Spouse/significant other;Children Available Help at Discharge: Family Type of Home: House Home Access: Ramped entrance(+1 step)     Home Layout: One level Home Equipment: Environmental consultantWalker - 2 wheels;Cane - single point;Bedside commode;Crutches;Wheelchair - manual;Shower seat      Prior Function Level of Independence: Independent with assistive device(s)         Comments: pt using RW for ambulation PTA, per pt's wife he has been using rollator and had a fall with rollator. The rollator is now broken.     Hand Dominance   Dominant  Hand: Right    Extremity/Trunk Assessment   Upper Extremity Assessment Upper Extremity Assessment: Overall WFL for tasks assessed    Lower Extremity Assessment Lower Extremity Assessment: Overall WFL for tasks assessed;RLE deficits/detail RLE Deficits / Details: suspected post-surgical hip weakness; able to perform ankle  pumps, quad set, limited heel slide due to pain RLE Sensation: WNL LLE Sensation: WNL    Cervical / Trunk Assessment Cervical / Trunk Assessment: Normal  Communication   Communication: No difficulties  Cognition Arousal/Alertness: Awake/alert Behavior During Therapy: WFL for tasks assessed/performed Overall Cognitive Status: Within Functional Limits for tasks assessed                                        General Comments      Exercises     Assessment/Plan    PT Assessment Patient needs continued PT services  PT Problem List Decreased strength;Decreased mobility;Decreased range of motion;Decreased activity tolerance;Decreased balance;Decreased knowledge of use of DME;Pain;Obesity       PT Treatment Interventions DME instruction;Therapeutic activities;Gait training;Therapeutic exercise;Patient/family education;Balance training;Stair training;Functional mobility training    PT Goals (Current goals can be found in the Care Plan section)  Acute Rehab PT Goals Patient Stated Goal: go home PT Goal Formulation: With patient Time For Goal Achievement: 01/13/19 Potential to Achieve Goals: Good    Frequency 7X/week   Barriers to discharge        Co-evaluation               AM-PAC PT "6 Clicks" Mobility  Outcome Measure Help needed turning from your back to your side while in a flat bed without using bedrails?: A Little Help needed moving from lying on your back to sitting on the side of a flat bed without using bedrails?: A Little Help needed moving to and from a bed to a chair (including a wheelchair)?: A Little Help needed standing up from a chair using your arms (e.g., wheelchair or bedside chair)?: A Little Help needed to walk in hospital room?: A Little Help needed climbing 3-5 steps with a railing? : A Lot 6 Click Score: 17    End of Session Equipment Utilized During Treatment: Gait belt Activity Tolerance: Patient tolerated treatment  well Patient left: with call bell/phone within reach;in chair;with chair alarm set;with family/visitor present(SCD machine not in pt room, RN notified) Nurse Communication: Mobility status PT Visit Diagnosis: Other abnormalities of gait and mobility (R26.89);Difficulty in walking, not elsewhere classified (R26.2)    Time: 2956-2130 PT Time Calculation (min) (ACUTE ONLY): 19 min   Charges:   PT Evaluation $PT Eval Low Complexity: 1 Low         Tanishka Drolet Conception Chancy, PT Acute Rehabilitation Services Pager [phone]  Office [phone]   Nicky Milhouse D Elonda Husky 01/06/2019, 5:23 PM

## 2019-01-06 NOTE — Anesthesia Procedure Notes (Signed)
Date/Time: 01/06/2019 10:58 AM Performed by: Sharlette Dense, CRNA Oxygen Delivery Method: Simple face mask

## 2019-01-06 NOTE — Op Note (Signed)
NAME:  Steven Allen                ACCOUNT NO.: [id number]      MEDICAL RECORD NO.: [id number]      FACILITY:  [facility]      PHYSICIAN:  Shelda Pal  DATE OF BIRTH:  Oct 26, 1960     DATE OF PROCEDURE:  01/06/2019                                 OPERATIVE REPORT         PREOPERATIVE DIAGNOSIS: Right  hip osteoarthritis.      POSTOPERATIVE DIAGNOSIS:  Right hip osteoarthritis.      PROCEDURE:  Right total hip replacement through an anterior approach   utilizing DePuy THR system, component size 60mm pinnacle cup, a size 36+4 neutral   Altrex liner, a size 9 Hi Actis stem with a 36+5 delta ceramic   ball.      SURGEON:  Madlyn Frankel. Charlann Boxer, M.D.      ASSISTANT:  Lanney Gins, PA-C     ANESTHESIA:  Spinal.      SPECIMENS:  None.      COMPLICATIONS:  None.      BLOOD LOSS:  600 cc     DRAINS:  None.      INDICATION OF THE PROCEDURE:  Steven Allen is a 58 y.o. male who had   presented to office for evaluation of right hip pain.  Radiographs revealed   progressive degenerative changes with bone-on-bone   articulation of the  hip joint, including subchondral cystic changes and osteophytes.  The patient had painful limited range of   motion significantly affecting their overall quality of life and function.  The patient was failing to    respond to conservative measures including medications and/or injections and activity modification and at this point was ready   to proceed with more definitive measures.  Consent was obtained for   benefit of pain relief.  Specific risks of infection, DVT, component   failure, dislocation, neurovascular injury, and need for revision surgery were reviewed in the office as well discussion of   the anterior versus posterior approach were reviewed.     PROCEDURE IN DETAIL:  The patient was brought to operative theater.   Once adequate anesthesia, preoperative antibiotics, 3 gm of Ancef, 1 gm of Tranexamic Acid, and 10 mg  of Decadron were administered, the patient was positioned supine on the [employer] table.  Once the patient was safely positioned with adequate padding of boney prominences we predraped out the hip, and used fluoroscopy to confirm orientation of the pelvis.      The right hip was then prepped and draped from proximal iliac crest to   mid thigh with a shower curtain technique.      Time-out was performed identifying the patient, planned procedure, and the appropriate extremity.     An incision was then made 2 cm lateral to the   anterior superior iliac spine extending over the orientation of the   tensor fascia lata muscle and sharp dissection was carried down to the   fascia of the muscle.      The fascia was then incised.  The muscle belly was identified and swept   laterally and retractor placed along the superior neck.  Following   cauterization of the circumflex vessels and removing some pericapsular  fat, a second cobra retractor was placed on the inferior neck.  A T-capsulotomy was made along the line of the   superior neck to the trochanteric fossa, then extended proximally and   distally.  Tag sutures were placed and the retractors were then placed   intracapsular.  We then identified the trochanteric fossa and   orientation of my neck cut and then made a neck osteotomy with the femur on traction.  The femoral   head was removed without difficulty or complication.  Traction was let   off and retractors were placed posterior and anterior around the   acetabulum.      The labrum and foveal tissue were debrided.  I began reaming with a 52 mm   reamer and reamed up to 62 mm reamer with good bony bed preparation and a 64 mm  cup was chosen.  The final 64 mm Pinnacle cup was then impacted under fluoroscopy to confirm the depth of penetration and orientation with respect to   Abduction and forward flexion.  A screw was placed into the ilium followed by the hole eliminator.  The final    36+4 neutral Altrex liner was impacted with good visualized rim fit.  The cup was positioned anatomically within the acetabular portion of the pelvis.      At this point, the femur was rolled to 100 degrees.  Further capsule was   released off the inferior aspect of the femoral neck.  I then   released the superior capsule proximally.  With the leg in a neutral position the hook was placed laterally   along the femur under the vastus lateralis origin and elevated manually and then held in position using the hook attachment on the bed.  The leg was then extended and adducted with the leg rolled to 100   degrees of external rotation.  Retractors were placed along the medial calcar and posteriorly over the greater trochanter.  Once the proximal femur was fully   exposed, I used a box osteotome to set orientation.  I then began   broaching with the starting chili pepper broach and passed this by hand and then broached up to 9.  With the 9 broach in place I chose a high offset neck and did several trial reductions.  The offset was appropriate, leg lengths   appeared to be equal best matched with the +5 head ball trial confirmed radiographically.   Given these findings, I went ahead and dislocated the hip, repositioned all   retractors and positioned the right hip in the extended and abducted position.  The final 9 Hi Actis stem was   chosen and it was impacted down to the level of neck cut.  Based on this   and the trial reductions, a final 36+5 delta ceramic ball was chosen and   impacted onto a clean and dry trunnion, and the hip was reduced.  The   hip had been irrigated throughout the case again at this point.  I did   reapproximate the superior capsular leaflet to the anterior leaflet   using #1 Vicryl.  The fascia of the   tensor fascia lata muscle was then reapproximated using #1 Vicryl and #0 Stratafix sutures.  The   remaining wound was closed with 2-0 Vicryl and running 4-0 Monocryl.   The  hip was cleaned, dried, and dressed sterilely using Dermabond and   Aquacel dressing.  The patient was then brought   to recovery room in  stable condition tolerating the procedure well.    Danae Orleans, PA-C was present for the entirety of the case involved from   preoperative positioning, perioperative retractor management, general   facilitation of the case, as well as primary wound closure as assistant.            Pietro Cassis Alvan Dame, M.D.        01/06/2019 12:35 PM

## 2019-01-07 ENCOUNTER — Encounter (HOSPITAL_COMMUNITY): Payer: Self-pay | Admitting: Orthopedic Surgery

## 2019-01-07 LAB — CBC
HCT: 34.4 % — ABNORMAL LOW (ref 39.0–52.0)
Hemoglobin: 10.6 g/dL — ABNORMAL LOW (ref 13.0–17.0)
MCH: 28.4 pg (ref 26.0–34.0)
MCHC: 30.8 g/dL (ref 30.0–36.0)
MCV: 92.2 fL (ref 80.0–100.0)
Platelets: 157 10*3/uL (ref 150–400)
RBC: 3.73 MIL/uL — ABNORMAL LOW (ref 4.22–5.81)
RDW: 13.2 % (ref 11.5–15.5)
WBC: 10.4 10*3/uL (ref 4.0–10.5)
nRBC: 0 % (ref 0.0–0.2)

## 2019-01-07 LAB — BASIC METABOLIC PANEL
Anion gap: 7 (ref 5–15)
BUN: 14 mg/dL (ref 6–20)
CO2: 23 mmol/L (ref 22–32)
Calcium: 8.6 mg/dL — ABNORMAL LOW (ref 8.9–10.3)
Chloride: 107 mmol/L (ref 98–111)
Creatinine, Ser: 0.78 mg/dL (ref 0.61–1.24)
GFR calc Af Amer: >60 mL/min (ref >60)
GFR calc non Af Amer: >60 mL/min (ref >60)
Glucose, Bld: 149 mg/dL — ABNORMAL HIGH (ref 70–99)
Potassium: 3.9 mmol/L (ref 3.5–5.1)
Sodium: 137 mmol/L (ref 135–145)

## 2019-01-07 MED ORDER — METHOCARBAMOL 500 MG PO TABS: 500.0000 mg | ORAL_TABLET | Freq: Four times a day (QID) | ORAL | 0 refills | Status: AC | PRN

## 2019-01-07 MED ORDER — DOCUSATE SODIUM 100 MG PO CAPS: 100.0000 mg | ORAL_CAPSULE | Freq: Two times a day (BID) | ORAL | 0 refills | Status: AC

## 2019-01-07 MED ORDER — FERROUS SULFATE 325 (65 FE) MG PO TABS: 325.0000 mg | ORAL_TABLET | Freq: Three times a day (TID) | ORAL | 0 refills | Status: AC

## 2019-01-07 MED ORDER — POLYETHYLENE GLYCOL 3350 17 G PO PACK: 17.0000 g | PACK | Freq: Two times a day (BID) | ORAL | 0 refills | Status: AC

## 2019-01-07 MED ORDER — ASPIRIN 81 MG PO CHEW: 81.0000 mg | CHEWABLE_TABLET | Freq: Two times a day (BID) | ORAL | 0 refills | Status: AC

## 2019-01-07 MED ORDER — HYDROCODONE-ACETAMINOPHEN 5-325 MG PO TABS: 1.0000 | ORAL_TABLET | ORAL | 0 refills | Status: AC | PRN

## 2019-01-07 NOTE — Progress Notes (Signed)
Physical Therapy Treatment Patient Details Name: Marianne SofiaFranklin Guzek MRN: [id number] DOB: 04-13-1961 Today's Date: 01/07/2019    History of Present Illness 58 yo male s/p R DA-THA on 01/06/19. PMH includes L THA 11/25/18, CAD, obesity, DM, OA, HTN, lap gastric sleeve.    PT Comments    Pt is progressing with mobility. Reviewed/practiced exercises, gait training. Pt declined to practice going up/down 1 step. He stated he plans to use the ramp. Wife was present during session. She encouraged him to have a 2nd session and to practice 1 step however pt continued to decline/deny need to. Issued HEP for pt to perform 2x/day for both LEs. Instructed pt to walk often at home. Wife reported pt has been using rollator to "ride" on. I strongly discouraged this! Encouraged pt and wife to have a discussion with surgeon regarding OP PT when they have f/u appt in a couple of weeks. All education completed. Okay to d/c from PT standpoint.    Follow Up Recommendations  Follow surgeon's recommendation for DC plan and follow-up therapies (pt could benefit from OP PT- L/R hip replacements within 6 weeks of one another AND pt was WC bound prior to surgeries)   Equipment Recommendations  None recommended by PT    Recommendations for Other Services       Precautions / Restrictions Precautions Precautions: Fall Restrictions Weight Bearing Restrictions: No Other Position/Activity Restrictions: WBAT    Mobility  Bed Mobility Overal bed mobility: Needs Assistance Bed Mobility: Supine to Sit     Supine to sit: Min assist;HOB elevated     General bed mobility comments: Assist for R LE. Increased time and effort for pt.  Transfers Overall transfer level: Needs assistance Equipment used: Rolling walker (2 wheeled) Transfers: Sit to/from Stand Sit to Stand: Min guard;From elevated surface         General transfer comment: Close guard for safety. VCs safety, hand  placement  Ambulation/Gait Ambulation/Gait assistance: Min guard Gait Distance (Feet): 100 Feet Assistive device: Rolling walker (2 wheeled) Gait Pattern/deviations: Step-through pattern;Decreased stride length;Trunk flexed     General Gait Details: Close guard for safety. Pt fatigues fairly easily (recent L hip replacement + WC bound prior to that).   Stairs Stairs: (pt declined practice. he plans to use ramp)           Wheelchair Mobility    Modified Rankin (Stroke Patients Only)       Balance Overall balance assessment: History of Falls;Needs assistance         Standing balance support: Bilateral upper extremity supported Standing balance-Leahy Scale: Poor                              Cognition Arousal/Alertness: Awake/alert Behavior During Therapy: WFL for tasks assessed/performed Overall Cognitive Status: Within Functional Limits for tasks assessed                                        Exercises Total Joint Exercises Ankle Circles/Pumps: AROM;Both;10 reps;Supine Quad Sets: AROM;Both;10 reps;Supine Heel Slides: AAROM;Right;10 reps;Supine Hip ABduction/ADduction: AAROM;Right;10 reps;Supine    General Comments        Pertinent Vitals/Pain Pain Assessment: 0-10 Pain Score: 4  Pain Location: R hip Pain Descriptors / Indicators: Sore Pain Intervention(s): Limited activity within patient's tolerance    Home Living  Prior Function            PT Goals (current goals can now be found in the care plan section) Progress towards PT goals: Progressing toward goals    Frequency    7X/week      PT Plan Current plan remains appropriate    Co-evaluation              AM-PAC PT "6 Clicks" Mobility   Outcome Measure  Help needed turning from your back to your side while in a flat bed without using bedrails?: A Little Help needed moving from lying on your back to sitting on the side  of a flat bed without using bedrails?: A Little Help needed moving to and from a bed to a chair (including a wheelchair)?: A Little Help needed standing up from a chair using your arms (e.g., wheelchair or bedside chair)?: A Little Help needed to walk in hospital room?: A Little Help needed climbing 3-5 steps with a railing? : A Lot 6 Click Score: 17    End of Session Equipment Utilized During Treatment: Gait belt Activity Tolerance: Patient tolerated treatment well Patient left: in chair;with call bell/phone within reach;with family/visitor present   PT Visit Diagnosis: Difficulty in walking, not elsewhere classified (R26.2);Other abnormalities of gait and mobility (R26.89)     Time: 4193-7902 PT Time Calculation (min) (ACUTE ONLY): 24 min  Charges:  $Gait Training: 8-22 mins $Therapeutic Exercise: 8-22 mins                        Weston Anna, PT Acute Rehabilitation Services Pager: [phone] Office: [phone]  '

## 2019-01-07 NOTE — Anesthesia Postprocedure Evaluation (Signed)
Anesthesia Post Note  Patient: [name]  Procedure(s) Performed: TOTAL HIP ARTHROPLASTY ANTERIOR APPROACH (Right Hip)     Patient location during evaluation: PACU Anesthesia Type: Spinal Level of consciousness: oriented and awake and alert Pain management: pain level controlled Vital Signs Assessment: post-procedure vital signs reviewed and stable Respiratory status: spontaneous breathing, respiratory function stable and patient connected to nasal cannula oxygen Cardiovascular status: blood pressure returned to baseline and stable Postop Assessment: no headache, no backache and no apparent nausea or vomiting Anesthetic complications: no    Last Vitals:  Vitals:   01/07/19 0113 01/07/19 0628  BP: 134/70 121/71  Pulse: 68 68  Resp: 14 16  Temp: 36.6 C 36.8 C  SpO2: 96% 97%    Last Pain:  Vitals:   01/07/19 0323  TempSrc:   PainSc: 2                  [name]

## 2019-01-07 NOTE — Progress Notes (Signed)
     Subjective: 1 Day Post-Op Procedure(s) (LRB): TOTAL HIP ARTHROPLASTY ANTERIOR APPROACH (Right)   Patient reports pain as mild, pain controlled.  No reported events throughout the night.  I explained to the patient the procedure, findings and expectations moving forward.  Patient be ready to be discharged home, if he does well with therapy.  Patient will follow-up in 2 weeks in the clinic.  Patient is to call with any questions or concerns.     Objective:   VITALS:   Vitals:   01/07/19 0113 01/07/19 0628  BP: 134/70 121/71  Pulse: 68 68  Resp: 14 16  Temp: 97.9 F (36.6 C) 98.3 F (36.8 C)  SpO2: 96% 97%    Dorsiflexion/Plantar flexion intact Incision: dressing C/D/I No cellulitis present Compartment soft  LABS Recent Labs    01/07/19 0229  HGB 10.6*  HCT 34.4*  WBC 10.4  PLT 157    Recent Labs    01/07/19 0229  NA 137  K 3.9  BUN 14  CREATININE 0.78  GLUCOSE 149*     Assessment/Plan: 1 Day Post-Op Procedure(s) (LRB): TOTAL HIP ARTHROPLASTY ANTERIOR APPROACH (Right) Foley cath d/c'ed Advance diet Up with therapy D/C IV fluids Discharge home Follow up in 2 weeks at [facility]. Follow up with OLIN,Leverett Camplin D in 2 weeks.  Contact information:  [facility] [address], [address] [address] [phone]    Morbid Obesity (BMI >40)  Estimated body mass index is 41.22 kg/m as calculated from the following:   Height as of this encounter: 5\' 10"  (1.778 m).   Weight as of this encounter: 130.3 kg. Patient also counseled that weight may inhibit the healing process Patient counseled that losing weight will help with future health issues       West Pugh. Aulton Routt   PAC  01/07/2019, 9:11 AM

## 2019-01-08 NOTE — Discharge Summary (Signed)
Physician Discharge Summary  Patient ID: Steven Allen MRN: [id number] DOB/AGE: October 10, 1960 58 y.o.  Admit date: 01/06/2019 Discharge date: 01/07/2019   Procedures:  Procedure(s) (LRB): TOTAL HIP ARTHROPLASTY ANTERIOR APPROACH (Right)  Attending Physician:  Dr. Durene RomansMatthew Olin   Admission Diagnoses:   Right hip primary OA / pain  Discharge Diagnoses:  Principal Problem:   S/P right THA, AA Active Problems:   Severe obesity (BMI >= 40) (HCC)  Past Medical History:  Diagnosis Date   Body mass index (BMI) 45.0-49.9, adult [facility](HCC)    Chronic hip pain    Coronary artery disease    cardiology -- dr end-- nuclear stress test 11-22-2017 showed abnormal with intermediate risk , recommended coronary CTA and if no obstruction pt cleared for surgery (per final result non-obstructve cad in distal LAD and RCA   Decreased strength of upper extremity    Exercise counseling    Full dentures    Hyperlipidemia    Hypertension    Morbid obesity due to excess calories (HCC)    OA (osteoarthritis)    bilateral hips and knees   Type 2 diabetes mellitus (HCC)    followed by pcp, before he lost weight with gastric sleeve   Uses wheelchair    due to bilateral hip pain   Wears glasses     HPI:     Steven Allen, 58 y.o. male, has a history of pain and functional disability in the right hip(s) due to arthritis and patient has failed non-surgical conservative treatments for greater than 12 weeks to include NSAID's and/or analgesics, use of assistive devices and activity modification.  Onset of symptoms was gradual starting 2+ years ago with gradually worsening course since that time.The patient noted prior procedures of the hip to include arthroplasty on the left hip(s).  Patient currently rates pain in the right hip at 9 out of 10 with activity. Patient has night pain, worsening of pain with activity and weight bearing, trendelenberg gait, pain that interfers with activities of daily  living and pain with passive range of motion. Patient has evidence of periarticular osteophytes and joint space narrowing by imaging studies. This condition presents safety issues increasing the risk of falls.  There is no current active infection.  Risks, benefits and expectations were discussed with the patient.  Risks including but not limited to the risk of anesthesia, blood clots, nerve damage, blood vessel damage, failure of the prosthesis, infection and up to and including death.  Patient understand the risks, benefits and expectations and wishes to proceed with surgery.   PCP: Alden HippEverhart, Jamus, PA   Discharged Condition: good  Hospital Course:  Patient underwent the above stated procedure on 01/06/2019. Patient tolerated the procedure well and brought to the recovery room in good condition and subsequently to the floor.  POD #1 BP: 121/71 ; Pulse: 68 ; Temp: 98.3 F (36.8 C) ; Resp: 16 Patient reports pain as mild, pain controlled.  No reported events throughout the night.  I explained to the patient the procedure, findings and expectations moving forward.  Patient be ready to be discharged home. Dorsiflexion/plantar flexion intact, incision: dressing C/D/I, no cellulitis present and compartment soft.   LABS  Basename    HGB     10.6  HCT     34.4     Discharge Exam: General appearance: alert, cooperative and no distress Extremities: Homans sign is negative, no sign of DVT, no edema, redness or tenderness in the calves or thighs and no ulcers,  gangrene or trophic changes  Disposition:  Home with follow up in 2 weeks   Follow-up Information    Durene Romans, MD. Schedule an appointment as soon as possible for a visit in 2 weeks.   Specialty: Orthopedic Surgery Contact information: [address] [place] [address] [phone]           Discharge Instructions    Call MD / Call 911   Complete by: As directed    If you experience chest pain or  shortness of breath, CALL 911 and be transported to the hospital emergency room.  If you develope a fever above 101 F, pus (white drainage) or increased drainage or redness at the wound, or calf pain, call your surgeon's office.   Change dressing   Complete by: As directed    Maintain surgical dressing until follow up in the clinic. If the edges start to pull up, may reinforce with tape. If the dressing is no longer working, may remove and cover with gauze and tape, but must keep the area dry and clean.  Call with any questions or concerns.   Constipation Prevention   Complete by: As directed    Drink plenty of fluids.  Prune juice may be helpful.  You may use a stool softener, such as Colace (over the counter) 100 mg twice a day.  Use MiraLax (over the counter) for constipation as needed.   Diet - low sodium heart healthy   Complete by: As directed    Discharge instructions   Complete by: As directed    Maintain surgical dressing until follow up in the clinic. If the edges start to pull up, may reinforce with tape. If the dressing is no longer working, may remove and cover with gauze and tape, but must keep the area dry and clean.  Follow up in 2 weeks at [facility]. Call with any questions or concerns.   Increase activity slowly as tolerated   Complete by: As directed    Weight bearing as tolerated with assist device (walker, cane, etc) as directed, use it as long as suggested by your surgeon or therapist, typically at least 4-6 weeks.   TED hose   Complete by: As directed    Use stockings (TED hose) for 2 weeks on both leg(s).  You may remove them at night for sleeping.      Allergies as of 01/07/2019   No Known Allergies     Medication List    STOP taking these medications   HYDROcodone-acetaminophen 7.5-325 MG tablet Commonly known as: Norco Replaced by: HYDROcodone-acetaminophen 5-325 MG tablet     TAKE these medications   aspirin 81 MG chewable tablet Commonly  known as: Aspirin Childrens Chew 1 tablet (81 mg total) by mouth 2 (two) times daily. Take for 4 weeks, then resume regular dose.   atorvastatin 20 MG tablet Commonly known as: LIPITOR Take 20 mg by mouth daily.   BARIATRIC MULTIVITAMINS/IRON PO Take 1 tablet by mouth daily.   Calcium Carbonate-Vitamin D 600-400 MG-UNIT tablet Take 1 tablet by mouth 2 (two) times daily.   docusate sodium 100 MG capsule Commonly known as: Colace Take 1 capsule (100 mg total) by mouth 2 (two) times daily.   ferrous sulfate 325 (65 FE) MG tablet Commonly known as: FerrouSul Take 1 tablet (325 mg total) by mouth 3 (three) times daily with meals for 14 days.   HYDROcodone-acetaminophen 5-325 MG tablet Commonly known as: Norco Take 1-2 tablets by mouth  every 4 (four) hours as needed for moderate pain or severe pain. Replaces: HYDROcodone-acetaminophen 7.5-325 MG tablet   losartan 50 MG tablet Commonly known as: COZAAR Take 50 mg by mouth daily.   methocarbamol 500 MG tablet Commonly known as: Robaxin Take 1 tablet (500 mg total) by mouth every 6 (six) hours as needed for muscle spasms. What changed: Another medication with the same name was removed. Continue taking this medication, and follow the directions you see here.   polyethylene glycol 17 g packet Commonly known as: MIRALAX / GLYCOLAX Take 17 g by mouth 2 (two) times daily. What changed: when to take this            Discharge Care Instructions  (From admission, onward)         Start     Ordered   01/07/19 0000  Change dressing    Comments: Maintain surgical dressing until follow up in the clinic. If the edges start to pull up, may reinforce with tape. If the dressing is no longer working, may remove and cover with gauze and tape, but must keep the area dry and clean.  Call with any questions or concerns.   01/07/19 0914           Signed: West Pugh. Solly Derasmo   PA-C  01/08/2019, 8:30 AM

## 2019-01-29 ENCOUNTER — Other Ambulatory Visit: Payer: Self-pay

## 2019-01-29 ENCOUNTER — Encounter: Payer: Self-pay | Admitting: Dietician

## 2019-01-29 ENCOUNTER — Encounter: Payer: Medicare HMO | Attending: General Practice | Admitting: Dietician

## 2019-01-29 DIAGNOSIS — E669 Obesity, unspecified: Secondary | ICD-10-CM | POA: Insufficient documentation

## 2019-01-29 NOTE — Progress Notes (Signed)
Bariatric Follow-Up Visit Medical Nutrition Therapy  Appt Start Time: 9:00am   End Time: 9:15am  Telephone Visit  This visit was completed via telephone due to the COVID-19 pandemic.   I spoke with Steven Allen and verified that I was speaking with the correct person with two patient identifiers (full name and date of birth).   I discussed the limitations related to this kind of visit and the patient is willing to proceed.   1 Year Post-Operative Sleeve Surgery Surgery Date: 03/17/2018  Pt's Expectations of Surgery/ Goals: to be able to walk again and have 2 hip replacements   Over the past 3 months, patient has had both hips replaced and is walking again using a walker. States he is very happy about this and is able to move around more than he has in a long time, especially since requiring a wheelchair for a while.   Patient states he pretty much eats whatever foods he wants to now, just not a lot at one time. States he usually eats 3 times per day and rarely snacks. Will usually have a protein shake a day. States he drinks a lot of water, 4 bottles that are 28 oz each.    NUTRITION ASSESSMENT  Anthropometrics  Start weight at [facility]: 360.6 lbs (01/22/2017) Today's weight: N/A (phone visit)  Body Composition Scale 08/13/2018 11/12/2018 01/29/2019  Weight  lbs 284.2 287.2 N/A (phone  BMI   visit)  Total Body Fat  % 39.5 37      Visceral Fat 32 32   Fat-Free Mass  % 60.4 62.9      Total Body Water  % 41.4 43.9      Muscle-Mass  lbs 53.6 54.1   Body Fat Displacement ---           Torso  lbs 69.7 66          Left Leg  lbs 13.9 13.2          Right Leg  lbs 13.9 13.2          Left Arm  lbs 6.9 6.6          Right Arm  lbs 6.9 6.6    24-Hr Dietary Recall First Meal: toast + 3 pieces bacon + orange   Snack: usually none  Second Meal: taco (shell + meat + cheese)  Snack: cashews   Third Meal: protein shake  Snack: usually none  Beverages: water, coffee    Estimated daily  fluid intake: 100+ oz Estimated daily protein intake: 80 g Supplements: bariatric MVI, calcium  Current average weekly physical activity: walking with a walker    Post-Op Goals Using straws: yes  Drinking while eating: yes (coffee w/ breakfast)  Chewing/swallowing difficulties: no Changes in vision: no Changes to mood/headaches: no Hair loss/changes to skin/nails: no Difficulty focusing/concentrating: no Sweating: no Dizziness/lightheadedness: no Palpitations: no  Carbonated/caffeinated beverages: caffeine  N/V/D/C/Gas: no Abdominal pain: no Dumping syndrome: no   NUTRITION DIAGNOSIS  Overweight/obesity ([place]-3.3) related to past poor dietary habits and physical inactivity as evidenced by patient w/ completed Sleeve Gastrectomy surgery following dietary guidelines for continued weight loss.   NUTRITION INTERVENTION Nutrition counseling (C-1) and education (E-2) to facilitate bariatric surgery goals, including: . Diet advancement to the next phase (phase 7) now including all foods for lifelong maintenance  . The importance of consuming adequate calories as well as certain nutrients daily due to the body's need for essential vitamins, minerals, and fats . The importance of  daily physical activity and to reach a goal of at least 150 minutes of moderate to vigorous physical activity weekly (or as directed by their physician) due to benefits such as increased musculature and improved lab values  Learning Style & Readiness for Change Teaching method utilized: Visual & Auditory  Demonstrated degree of understanding via: Teach Back  Barriers to learning/adherence to lifestyle change: None Identified    MONITORING & EVALUATION Dietary intake, weekly physical activity, body weight, and goals in 6 months.  Next Steps Patient is to follow-up in 6 months for 1 year and 6 months post-op follow-up.

## 2019-07-30 ENCOUNTER — Ambulatory Visit: Payer: Medicare HMO | Admitting: Dietician

## 2019-10-06 ENCOUNTER — Encounter (HOSPITAL_COMMUNITY): Payer: Self-pay

## 2020-06-03 ENCOUNTER — Telehealth: Payer: Self-pay | Admitting: Internal Medicine

## 2020-06-03 NOTE — Telephone Encounter (Signed)
Patient has been contacted at least 3 times for a recall, recall has been deleted  

## 2020-10-14 ENCOUNTER — Encounter (HOSPITAL_COMMUNITY): Payer: Self-pay | Admitting: *Deleted

## 2021-05-01 IMAGING — DX PORTABLE PELVIS 1-2 VIEWS
1 series · 1 of 1 positions shown · non-contrast
Comparison: 11/25/2018

CLINICAL DATA: S/p new total LEFT hip today, image done in
recovery.

EXAM:
PORTABLE PELVIS 1-2 VIEWS

[pelvis lat]
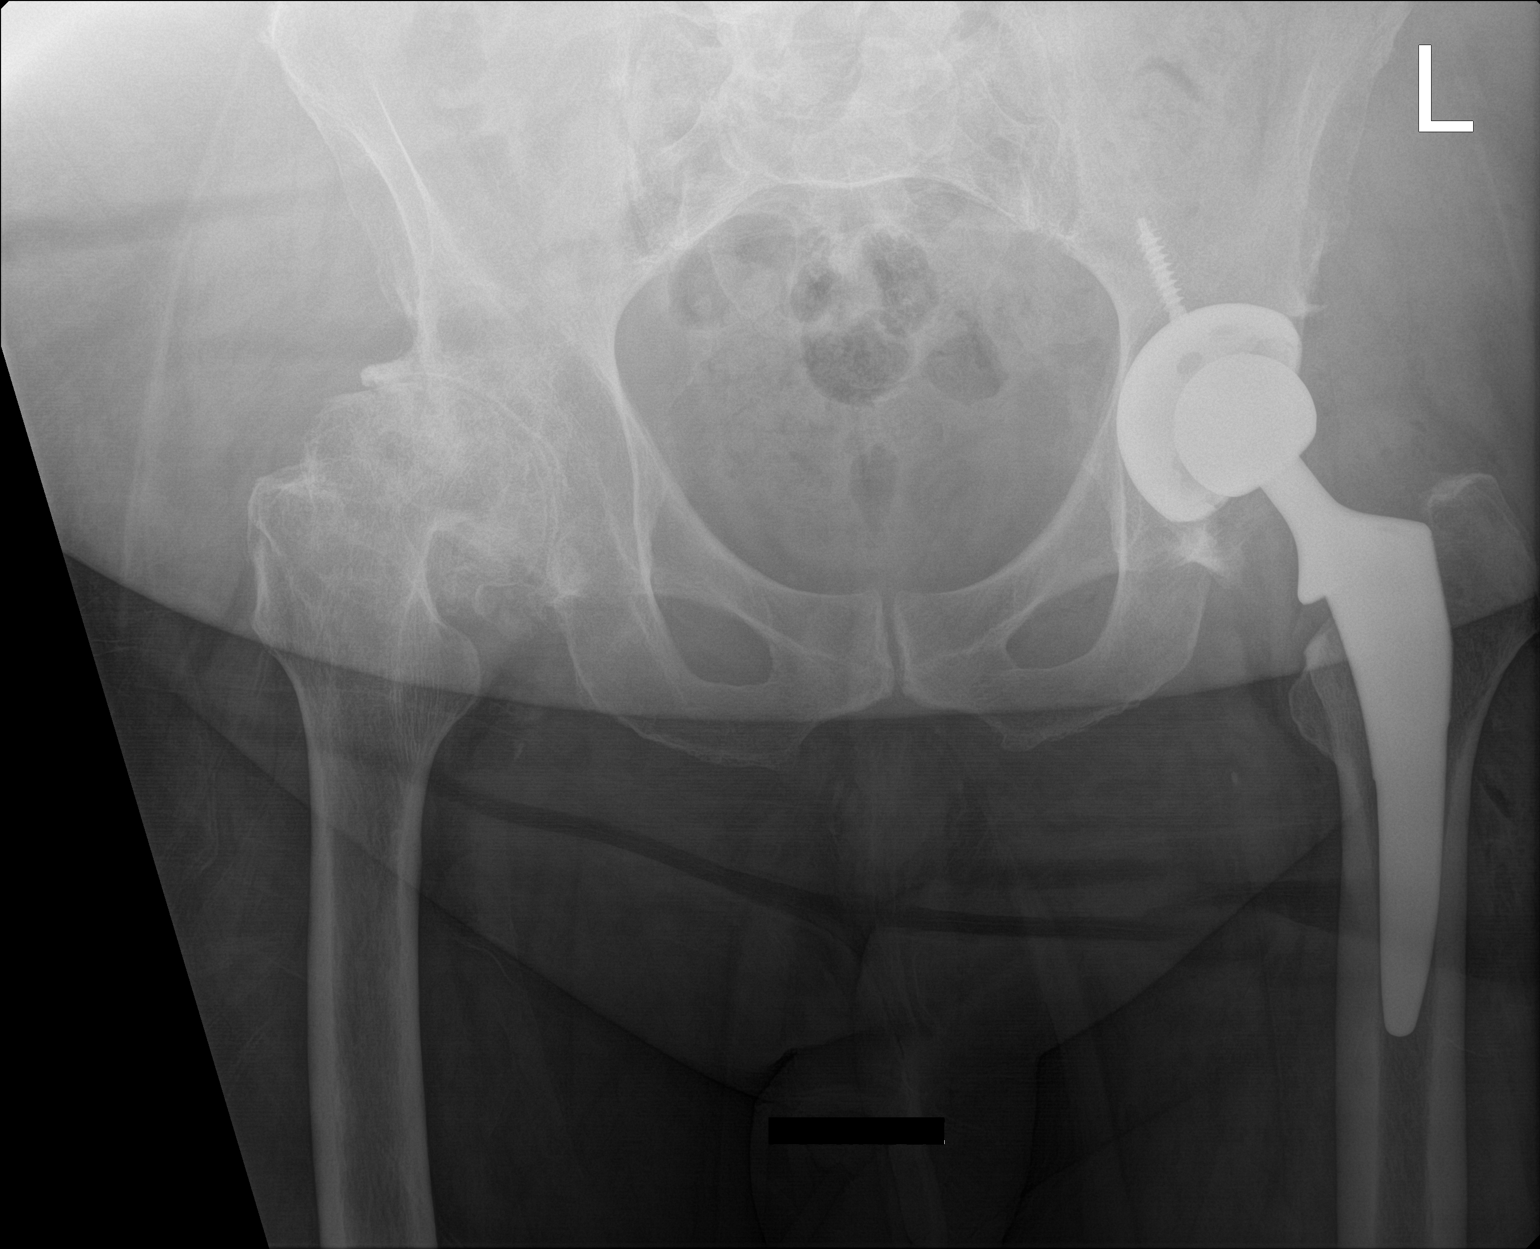

[1 of 1 positions shown; findings below may reference images not displayed]

FINDINGS: Patient has LEFT hip arthroplasty. The hardware appears well seated.
No evidence for dislocation on this frontal view.

Marked deformity and degenerative changes are identified in the
RIGHT hip.
IMPRESSION: Status post LEFT hip arthroplasty. No adverse features identified.

## 2021-05-01 IMAGING — RF DG C-ARM 1-60 MIN-NO REPORT
1 series · 2 of 2 positions shown · non-contrast
Comparison: None.

CLINICAL DATA: Fluoro provided for left hip total arthroplasty.

EXAM:
OPERATIVE LEFT HIP (WITH PELVIS IF PERFORMED) 2 VIEWS
TECHNIQUE: Fluoroscopic spot image(s) were submitted for interpretation
post-operatively.

[Series 1: run · 2 of 2 slices shown]
[im 1/2]
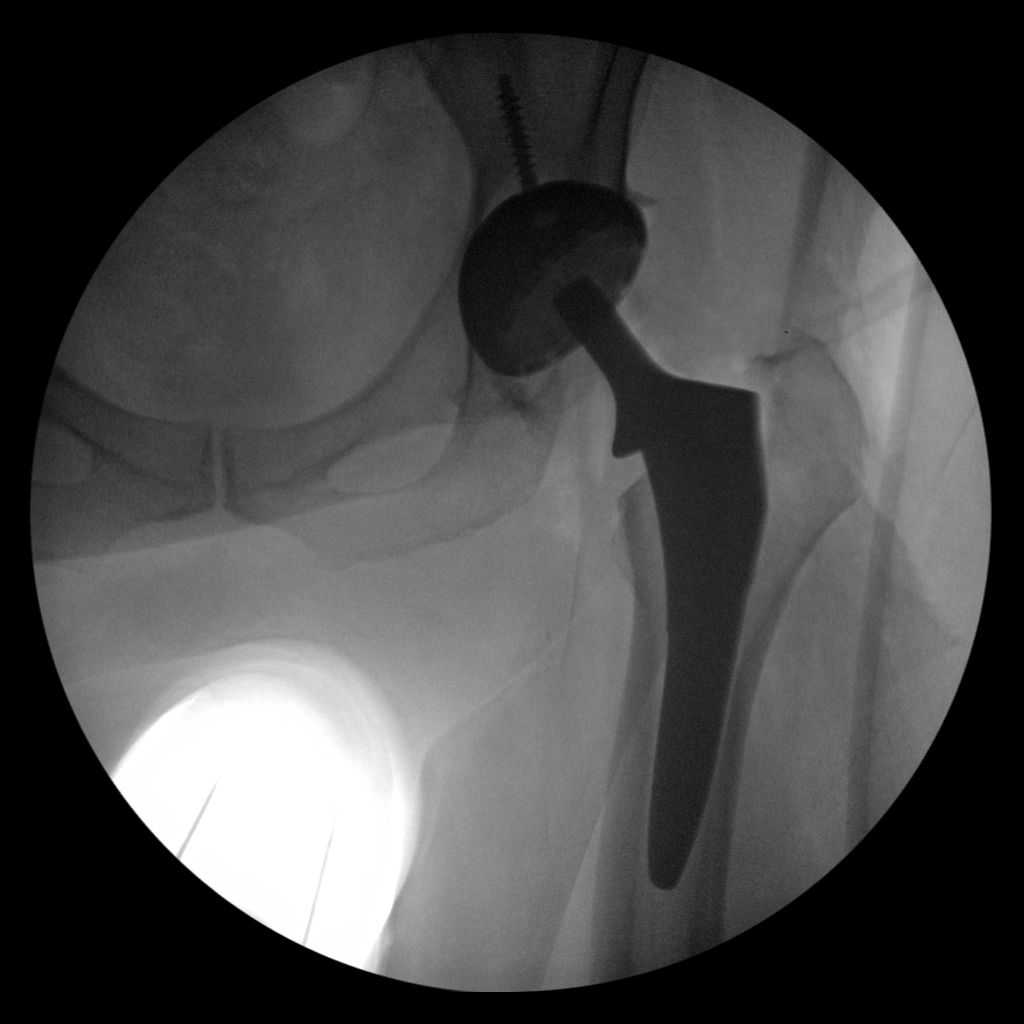
[im 2/2]
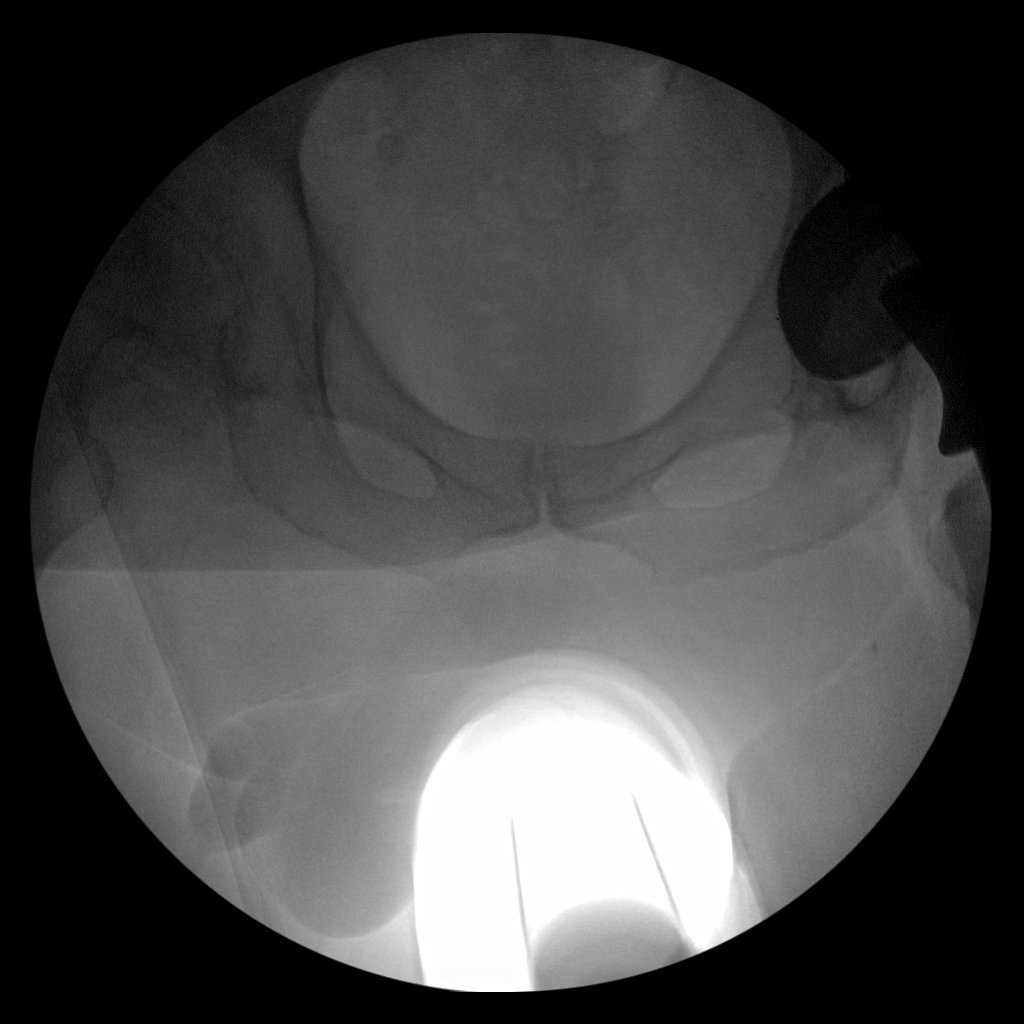

[2 of 2 positions shown; findings below may reference images not displayed]

FINDINGS: Two submitted images show placement of the femoral and acetabular
components of a total left hip arthroplasty. The components appear
well seated and aligned. No evidence of an operative complication.
IMPRESSION: Operative imaging for left hip total arthroplasty.

## 2021-10-12 ENCOUNTER — Encounter (HOSPITAL_COMMUNITY): Payer: Self-pay | Admitting: *Deleted

## 2022-10-11 ENCOUNTER — Encounter (HOSPITAL_COMMUNITY): Payer: Self-pay | Admitting: *Deleted

## 2023-10-10 ENCOUNTER — Encounter (HOSPITAL_COMMUNITY): Payer: Self-pay | Admitting: *Deleted
